# Patient Record
Sex: Female | Born: 1975 | Race: Asian | Hispanic: No | Marital: Married | Smoking: Never smoker
Health system: Southern US, Community
[De-identification: ages and names within clinical notes are randomized; demographics above are authoritative.]

## PROBLEM LIST (undated history)

## (undated) ENCOUNTER — Inpatient Hospital Stay (HOSPITAL_COMMUNITY): Payer: Self-pay

## (undated) ENCOUNTER — Inpatient Hospital Stay (HOSPITAL_COMMUNITY): Payer: BC Managed Care – PPO

## (undated) DIAGNOSIS — Z789 Other specified health status: Secondary | ICD-10-CM

## (undated) HISTORY — PX: WISDOM TOOTH EXTRACTION: SHX21

---

## 2012-11-02 LAB — OB RESULTS CONSOLE GC/CHLAMYDIA
Chlamydia: NEGATIVE
Gonorrhea: NEGATIVE

## 2012-11-02 LAB — OB RESULTS CONSOLE ANTIBODY SCREEN: Antibody Screen: NEGATIVE

## 2012-11-02 LAB — OB RESULTS CONSOLE RUBELLA ANTIBODY, IGM: RUBELLA: IMMUNE

## 2012-11-02 LAB — OB RESULTS CONSOLE HEPATITIS B SURFACE ANTIGEN: Hepatitis B Surface Ag: NEGATIVE

## 2012-11-02 LAB — OB RESULTS CONSOLE ABO/RH: RH Type: POSITIVE

## 2012-11-02 LAB — OB RESULTS CONSOLE RPR: RPR: NONREACTIVE

## 2012-11-02 LAB — OB RESULTS CONSOLE HIV ANTIBODY (ROUTINE TESTING): HIV: NONREACTIVE

## 2012-11-06 ENCOUNTER — Inpatient Hospital Stay (HOSPITAL_COMMUNITY): Admission: AD | Admit: 2012-11-06 | Payer: Self-pay | Source: Ambulatory Visit | Admitting: Obstetrics and Gynecology

## 2013-01-12 ENCOUNTER — Inpatient Hospital Stay (HOSPITAL_COMMUNITY)
Admission: AD | Admit: 2013-01-12 | Discharge: 2013-01-12 | Disposition: A | Discharge status: Home / Self Care | Payer: BC Managed Care – PPO | Source: Ambulatory Visit | Attending: Obstetrics and Gynecology | Admitting: Obstetrics and Gynecology

## 2013-01-12 ENCOUNTER — Inpatient Hospital Stay (HOSPITAL_COMMUNITY): Payer: BC Managed Care – PPO

## 2013-01-12 ENCOUNTER — Encounter (HOSPITAL_COMMUNITY): Payer: Self-pay

## 2013-01-12 DIAGNOSIS — O99891 Other specified diseases and conditions complicating pregnancy: Secondary | ICD-10-CM | POA: Insufficient documentation

## 2013-01-12 DIAGNOSIS — N949 Unspecified condition associated with female genital organs and menstrual cycle: Secondary | ICD-10-CM | POA: Insufficient documentation

## 2013-01-12 HISTORY — DX: Other specified health status: Z78.9

## 2013-01-12 LAB — URINALYSIS, ROUTINE W REFLEX MICROSCOPIC
Glucose, UA: NEGATIVE mg/dL
Hgb urine dipstick: NEGATIVE
Ketones, ur: NEGATIVE mg/dL
Protein, ur: NEGATIVE mg/dL
Specific Gravity, Urine: <1.005 — ABNORMAL LOW (ref 1.005–1.030)
Urobilinogen, UA: 0.2 mg/dL (ref 0.0–1.0)

## 2013-01-12 LAB — URINE MICROSCOPIC-ADD ON

## 2013-01-12 LAB — WET PREP, GENITAL: Trich, Wet Prep: NONE SEEN

## 2013-01-12 NOTE — MAU Note (Signed)
Patient is in with concern of possible rupture of membranes. She states she have increased watery clear vaginal discharge for 3 days. She states that she wears a pantiliner and it has gotten soaked. She denies vaginal bleeding, abdominal pain. She have twin. She reports good fetal movements

## 2013-01-12 NOTE — MAU Provider Note (Signed)
Pt with complaint of watery copious discharge that soaked a panty liner.  She denies having any bleeding, abd pain, ctxs or IC in the last 24 hrs  BP 119/71  Pulse 76  Temp(Src) 98.6 F (37 C) (Oral)  Resp 18  Ht 5\' 2"  (1.575 m)  Wt 152 lb 4 oz (69.06 kg)  BMI 27.84 kg/m2  FHTS Baseline: a 151 b 158 bpm  Toco none  Pt in NAD CV RRR Lungs CTAB abd  Gravid soft and NT GU no vb.  V/V WNL.  cx L/CL white discharge in the vault.  No blood EXt no calf tenderness Results for orders placed during the hospital encounter of 01/12/13 (from the past 72 hour(s))  URINALYSIS, ROUTINE W REFLEX MICROSCOPIC     Status: Abnormal   Collection Time    01/12/13 12:45 PM      Result Value Range   Color, Urine STRAW (*) YELLOW   APPearance CLEAR  CLEAR   Specific Gravity, Urine <1.005 (*) 1.005 - 1.030   pH 6.0  5.0 - 8.0   Glucose, UA NEGATIVE  NEGATIVE mg/dL   Hgb urine dipstick NEGATIVE  NEGATIVE   Bilirubin Urine NEGATIVE  NEGATIVE   Ketones, ur NEGATIVE  NEGATIVE mg/dL   Protein, ur NEGATIVE  NEGATIVE mg/dL   Urobilinogen, UA 0.2  0.0 - 1.0 mg/dL   Nitrite NEGATIVE  NEGATIVE   Leukocytes, UA TRACE (*) NEGATIVE  URINE MICROSCOPIC-ADD ON     Status: Abnormal   Collection Time    01/12/13 12:45 PM      Result Value Range   Squamous Epithelial / LPF RARE  RARE   WBC, UA 0-2  <3 WBC/hpf   Bacteria, UA FEW (*) RARE    Assessment and Plan [redacted]w[redacted]d  Copious white discharge Ferning neg Check wet prep Check [place] for cervical length and fluid

## 2013-02-12 ENCOUNTER — Other Ambulatory Visit: Payer: Self-pay | Admitting: Obstetrics and Gynecology

## 2013-02-12 ENCOUNTER — Ambulatory Visit (HOSPITAL_COMMUNITY)
Admission: RE | Admit: 2013-02-12 | Discharge: 2013-02-12 | Disposition: A | Discharge status: Home / Self Care | Payer: BC Managed Care – PPO | Source: Ambulatory Visit | Attending: Obstetrics and Gynecology | Admitting: Obstetrics and Gynecology

## 2013-02-12 ENCOUNTER — Inpatient Hospital Stay (HOSPITAL_COMMUNITY)
Admission: AD | Admit: 2013-02-12 | Discharge: 2013-03-01 | DRG: 781 | Disposition: A | Discharge status: Home / Self Care | Payer: BC Managed Care – PPO | Source: Ambulatory Visit | Attending: Obstetrics and Gynecology | Admitting: Obstetrics and Gynecology

## 2013-02-12 ENCOUNTER — Encounter (HOSPITAL_COMMUNITY): Payer: Self-pay

## 2013-02-12 ENCOUNTER — Encounter (HOSPITAL_COMMUNITY): Payer: Self-pay | Admitting: General Practice

## 2013-02-12 DIAGNOSIS — O368290 Fetal anemia and thrombocytopenia, unspecified trimester, not applicable or unspecified: Secondary | ICD-10-CM | POA: Diagnosis present

## 2013-02-12 DIAGNOSIS — O4700 False labor before 37 completed weeks of gestation, unspecified trimester: Secondary | ICD-10-CM | POA: Diagnosis present

## 2013-02-12 DIAGNOSIS — IMO0001 Reserved for inherently not codable concepts without codable children: Secondary | ICD-10-CM

## 2013-02-12 DIAGNOSIS — D649 Anemia, unspecified: Secondary | ICD-10-CM | POA: Diagnosis present

## 2013-02-12 DIAGNOSIS — O09519 Supervision of elderly primigravida, unspecified trimester: Secondary | ICD-10-CM | POA: Diagnosis present

## 2013-02-12 DIAGNOSIS — O364XX0 Maternal care for intrauterine death, not applicable or unspecified: Secondary | ICD-10-CM | POA: Diagnosis present

## 2013-02-12 DIAGNOSIS — IMO0002 Reserved for concepts with insufficient information to code with codable children: Principal | ICD-10-CM | POA: Diagnosis present

## 2013-02-12 DIAGNOSIS — O30039 Twin pregnancy, monochorionic/diamniotic, unspecified trimester: Secondary | ICD-10-CM | POA: Diagnosis present

## 2013-02-12 DIAGNOSIS — O409XX0 Polyhydramnios, unspecified trimester, not applicable or unspecified: Secondary | ICD-10-CM | POA: Diagnosis present

## 2013-02-12 DIAGNOSIS — O99019 Anemia complicating pregnancy, unspecified trimester: Secondary | ICD-10-CM | POA: Diagnosis present

## 2013-02-12 LAB — CBC
MCH: 31.6 pg (ref 26.0–34.0)
MCHC: 34.6 g/dL (ref 30.0–36.0)
Platelets: 292 10*3/uL (ref 150–400)
WBC: 9.2 10*3/uL (ref 4.0–10.5)

## 2013-02-12 LAB — TYPE AND SCREEN: ABO/RH(D): B POS

## 2013-02-12 MED ORDER — ZOLPIDEM TARTRATE 5 MG PO TABS: 5.0000 mg | ORAL_TABLET | Freq: Every evening | ORAL | Status: DC | PRN

## 2013-02-12 MED ORDER — CALCIUM CARBONATE ANTACID 500 MG PO CHEW: 2.0000 | CHEWABLE_TABLET | ORAL | Status: DC | PRN

## 2013-02-12 MED ORDER — DOCUSATE SODIUM 100 MG PO CAPS
100.0000 mg | ORAL_CAPSULE | Freq: Every day | ORAL | Status: DC
  Administered 2013-02-14 – 2013-03-01 (×16): 100 mg via ORAL
  Filled 2013-02-12 (×17): qty 1

## 2013-02-12 MED ORDER — ACETAMINOPHEN 325 MG PO TABS: 650.0000 mg | ORAL_TABLET | ORAL | Status: DC | PRN

## 2013-02-12 MED ORDER — INFLUENZA VAC SPLIT QUAD 0.5 ML IM SUSP
0.5000 mL | INTRAMUSCULAR | Status: AC
  Administered 2013-02-13: 0.5 mL via INTRAMUSCULAR
  Filled 2013-02-12: qty 0.5

## 2013-02-12 MED ORDER — DEXTROSE IN LACTATED RINGERS 5 % IV SOLN
INTRAVENOUS | Status: DC
  Administered 2013-02-12: 20:00:00 via INTRAVENOUS

## 2013-02-12 MED ORDER — PRENATAL MULTIVITAMIN CH
1.0000 | ORAL_TABLET | Freq: Every day | ORAL | Status: DC
  Administered 2013-02-13 – 2013-03-01 (×17): 1 via ORAL
  Filled 2013-02-12 (×18): qty 1

## 2013-02-12 MED ORDER — BETAMETHASONE SOD PHOS & ACET 6 (3-3) MG/ML IJ SUSP
12.0000 mg | INTRAMUSCULAR | Status: AC
  Administered 2013-02-12 – 2013-02-13 (×2): 12 mg via INTRAMUSCULAR
  Filled 2013-02-12 (×2): qty 2

## 2013-02-12 NOTE — ED Notes (Signed)
FHR 145; moderate variability; variable decels noted; contractions every 1-3 minutes X 50-120 seconds and mild - moderate intensity; patient states she feels pressure when contraction traced; fetus very active.

## 2013-02-12 NOTE — ED Notes (Signed)
Patient off EFM and ambulated to bathroom to void. Given water po. Patient complains of pressure with contractions. Dr Loree Fee aware.

## 2013-02-12 NOTE — Consult Note (Signed)
MFM Consultation:  The patient presents for ultrasound and consultation secondary to her monochorionic diamnionic twin gestation. 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  Severe TTTS before 26 weeks of gestation is associated with high risks of fetal loss, perinatal death and subsequent handicap in survivors.  The perinatal mortality rate is up to 90% with untreated severe disease.   TTTS is mediated through vascular placental connections.  The etiology is unknown.   Interfetal anastomoses are present in almost all monochorionic twin pregnancies although only a small percentage develops TTTS.  Communications between the recipient twin (larger twin with polyhydramnios) and donor twin (smaller twin with oligohydramnios) may be artery to artery, vein to artery or vein to vein.  Most likely, AV anastomoses are primarily responsible for exchange of blood from the recipient to the donor twin.  The diagnosis of TTTS is suspected when the following are present: a monochorionic diamnionic gestation with a single placenta, thin intervening membrane, polyhydramnios in the recipient twin's amniotic cavity (deepest vertical pocket of 8 cm or greater) and oligohydramnios or anhydramnios in the donor twin's amniotic cavity (deepest vertical pocket of 2 cm or less or a stuck twin).  Additional signs may include a distended bladder in the recipient twin, a small or empty bladder and growth restriction in the donor twin, abnormal umbilical artery Doppler velocimetry in either twin, hydrops in either twin, cardiac enlargement or failure, or fetal demise.  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 The implications of this diagnosis and the general management were discussed with the patient.      Recommendations:  TTTS is staged (Stage I-V) depending on severity of disease.  Stage I disease is generally managed expectantly with frequent ultrasound monitoring.  With Stage II-V disease, invasive treatment options are discussed.  There have been many treatment options suggested for TTTS including selective fetocide, cord coagulation, serial amnioreduction, microseptostomy of the intertwin membrane and fetoscopic laser photocoagulation of placental vascular anastomoses.  Fetoscopic laser coagulation of placental anastomoses has evolved as a favored therapeutic option for severe TTTS that occurs before [redacted] weeks EGA.  For patients presenting later than [redacted] weeks gestation, amnioreduction or microspetostomy appears to be favored over other treatment options.    I placed the patient on CEFM and contacted Dr. Normand Sloop (patient's OB provider).  I also contacted Dr. Berneta Sages of [facility] along with her nurse Irving Burton.  I am awaiting their return call to generate a disposition.I placed the patient on CEFM and contacted Dr. Normand Sloop (patient's OB provider).  I also contacted Dr. Berneta Sages of [facility] along with her nurse Irving Burton to learn she is not a candidate for invasive procedures to treat TTTS.    Recommendations: Hence, recommendations include the following: -initiate course of antenatal steroids; -CEFM with delivery for evidence of fetal distress -twice weekly ultrasounds (Monday/Thursday) for Doppler and hydrops. -patient was sent to antepartum for direct admission with my  recommendations communicated to Dr. Normand Sloop and the  patient.  Time Spent: I spent in excess of 60 minutes in consultation with this patient to review records, evaluate her case, and provide her with an adequate discussion and education.  More than 50% of this time was spent in direct face-to-face counseling. It was a pleasure seeing your patient in the office today.  Thank you for consultation. Please do not hesitate to contact our service for any further questions.   Thank you,  Sue Gibson Sue Gibson, Sue Sjogren, MD, MS, FACOG Assistant Professor Section of Maternal-Fetal Medicine [facility]

## 2013-02-12 NOTE — H&P (Signed)
Sue Gibson is a 37 y.o. female presenting for newly diagnosed TTS and fetal demise of twin B. History OB History   Grav Para Term Preterm Abortions TAB SAB Ect Mult Living   1         0     Past Medical History  Diagnosis Date  . Medical history non-contributory    Past Surgical History  Procedure Laterality Date  . Wisdom tooth extraction     Family History: family history includes Hypertension in her brother and father. Social History:  reports that she has never smoked. She has never used smokeless tobacco. She reports that she does not drink alcohol or use illicit drugs.   Prenatal Transfer Tool  Maternal Diabetes: No Genetic Screening: Normal Maternal Ultrasounds/Referrals: Abnormal:  Findings:   Other: Fetal Ultrasounds or other Referrals:  Referred to Materal Fetal Medicine  Maternal Substance Abuse:  No Significant Maternal Medications:  None Significant Maternal Lab Results:  None Other Comments:  None  ROS Physical Examination: General appearance - alert, well appearing, and in no distress Mouth - mucous membranes moist, pharynx normal without lesions Neck - supple, no significant adenopathy Heart - normal rate, regular rhythm, normal S1, S2, no murmurs, rubs, clicks or gallops Abdomen - soft, nontender, nondistended, no masses or organomegaly Gravid soft and NT Extremities - peripheral pulses normal, no pedal edema, no clubbing or cyanosis     Blood pressure 110/59, pulse 79, temperature 98.2 F (36.8 C), temperature source Oral, resp. rate 18, height 5\' 2"  (1.575 m), weight 162 lb (73.483 kg), last menstrual period 08/17/2012. Exam Physical Exam  Prenatal labs: ABO, Rh: --/--/B POS (11/07 1735) Antibody: PENDING (11/07 1735) Rubella:   RPR:    HBsAg:    HIV:    GBS:     Assessment/Plan: TTS with fetal demise of twin B Polyhydramnios Pt is not a candidate for any intervention with surgery MFM consult appreciated Admit to antepartum Pt EFM is  category 1 with frequent contractions.  Will give iv fluids.  Probably a result of poly Dopplers and MCA normal on twin A.  Plan twice weekly dopplers Betamethasone Check blood sugars starting in am Would plan delivery for fetal distress if present Plan NICU consult Pt agrees with the plan     Charley Lafrance A 02/12/2013, 6:58 PM

## 2013-02-12 NOTE — ED Notes (Signed)
Patient states UCs feel like pressure.  Did not realize the pressure was UCs. Has been having them over the last 3 weeks.  UCs are not painful.

## 2013-02-12 NOTE — ED Notes (Signed)
Patient removed from monitors for transport to room 151.

## 2013-02-12 NOTE — ED Notes (Signed)
Strip review by Dr. Loree Fee.

## 2013-02-13 LAB — GLUCOSE, CAPILLARY
Glucose-Capillary: 141 mg/dL — ABNORMAL HIGH (ref 70–99)
Glucose-Capillary: 129 mg/dL — ABNORMAL HIGH (ref 70–99)
Glucose-Capillary: 130 mg/dL — ABNORMAL HIGH (ref 70–99)

## 2013-02-13 MED ORDER — INFLUENZA VAC SPLIT QUAD 0.5 ML IM SUSP
0.5000 mL | INTRAMUSCULAR | Status: DC
  Filled 2013-02-13: qty 0.5

## 2013-02-13 MED ORDER — INSULIN ASPART 100 UNIT/ML ~~LOC~~ SOLN
0.0000 [IU] | Freq: Four times a day (QID) | SUBCUTANEOUS | Status: DC | PRN
  Administered 2013-02-13 – 2013-02-14 (×3): 4 [IU] via SUBCUTANEOUS

## 2013-02-13 MED ORDER — LACTATED RINGERS IV SOLN
INTRAVENOUS | Status: DC
  Administered 2013-02-13 – 2013-02-15 (×6): via INTRAVENOUS

## 2013-02-13 NOTE — Progress Notes (Signed)
Patient ID: Sue Gibson, female   DOB: 12-29-1975, 37 y.o.   MRN: [id number] Pt without complaints.  No leakage of fluid or VB.  Good FM  BP 87/44  Pulse 75  Temp(Src) 98.2 F (36.8 C) (Oral)  Resp 20  Ht 5\' 2"  (1.575 m)  Wt 159 lb 11.2 oz (72.439 kg)  BMI 29.20 kg/m2  LMP 08/17/2012  FHTS 135 category 1  Toco irregular, every 10-40 minutes  Pt in NAD CV RRR Lungs CTAB abd  Gravid soft and NT GU no vb EXt no calf tenderness Results for orders placed during the hospital encounter of 02/12/13 (from the past 72 hour(s))  CBC     Status: Abnormal   Collection Time    02/12/13  5:35 PM      Result Value Range   WBC 9.2  4.0 - 10.5 K/uL   RBC 3.58 (*) 3.87 - 5.11 MIL/uL   Hemoglobin 11.3 (*) 12.0 - 15.0 g/dL   HCT 78.2 (*) 95.6 - 21.3 %   MCV 91.3  78.0 - 100.0 fL   MCH 31.6  26.0 - 34.0 pg   MCHC 34.6  30.0 - 36.0 g/dL   RDW 08.6  57.8 - 46.9 %   Platelets 292  150 - 400 K/uL  TYPE AND SCREEN     Status: None   Collection Time    02/12/13  5:35 PM      Result Value Range   ABO/RH(D) B POS     Antibody Screen NEG     Sample Expiration 02/15/2013    ABO/RH     Status: None   Collection Time    02/12/13  5:35 PM      Result Value Range   ABO/RH(D) B POS    GLUCOSE, CAPILLARY     Status: Abnormal   Collection Time    02/13/13  7:17 AM      Result Value Range   Glucose-Capillary 141 (*) 70 - 99 mg/dL   Comment 1 Notify RN      Assessment and Plan [redacted]w[redacted]d  Pt stable Received first dose of steroids Check blood sugars with ISS NICU consult today Change fluid to LR fllu vaccie Dopplers on Baby A on Monday

## 2013-02-13 NOTE — Consult Note (Signed)
Neonatology Consult to Antenatal Patient:  I was asked by Dr. Normand Sloop to see this patient in order to provide antenatal counseling due to twin gestation with fetal demise of Twin B at 63 5/7 weeks. 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 There is polyhydramnios of Twin A but no hydrops on ultrasound.  Sue Gibson was admitted 11/7 at 25 4/[redacted] weeks GA. She is currently not having active labor. She is getting BMZ and is being monitored with ultrasound twice weekly. This is her first pregnancy. The fetus is female.  I spoke with the patient and her husband. We discussed the worst case of delivery in the next 1-2 days, including usual DR management, possible respiratory complications and need for support, IV access, feedings (mother desires breast feeding), LOS, Mortality and Morbidity, and long term outcomes. She had a few questions, which I answered. I offered a NICU tour to any interested family members and would be glad to come back if she or her husband have more questions later.  Thank you for asking me to see this patient.  Sue Sou, MD Neonatologist  The total length of face-to-face or floor/unit time for this encounter was 25 minutes. Counseling and/or coordination of care was 15 minutes of the above.

## 2013-02-14 LAB — GLUCOSE, CAPILLARY
Glucose-Capillary: 127 mg/dL — ABNORMAL HIGH (ref 70–99)
Glucose-Capillary: 111 mg/dL — ABNORMAL HIGH (ref 70–99)
Glucose-Capillary: 88 mg/dL (ref 70–99)

## 2013-02-14 NOTE — Progress Notes (Addendum)
  Subjective: Pt sitting up in knitting.  No complaints. Denies LOF, VB.  Feels cramping occasionally mostly when she needs to uriate.  Objective: BP 99/47  Pulse 84  Temp(Src) 98.7 F (37.1 C) (Oral)  Resp 18  Ht 5\' 2"  (1.575 m)  Wt 73.528 kg (162 lb 1.6 oz)  BMI 29.64 kg/m2  LMP 08/17/2012     Gen:  NAD, A&O Abdomen:  No fundal tenderness Ext:  No edema, no calf tenderness.  SCDs off in bed.  EM:  Accelerations, good variability.  Occasional variable deceleration.  Occasional contractions, irritability.  Assessment / Plan: [redacted]w[redacted]d -Twin-Twin transfusion with demise of Baby B.  S/p BMZ.   Stable. Continuous fetal monitoring. Dopplers twice weekly, Monday and Thursday.  Sue Gibson 02/14/2013, 1:08 PM

## 2013-02-14 NOTE — Progress Notes (Signed)
  Subjective: Pt sitting up in bed watching TV.  Pt without complaints at this time.  No leakage of fluid or VB. Good FM.  Objective: BP 97/53  Pulse 92  Temp(Src) 98.7 F (37.1 C) (Oral)  Resp 18  Ht 5\' 2"  (1.575 m)  Wt 73.528 kg (162 lb 1.6 oz)  BMI 29.64 kg/m2  LMP 08/17/2012      FHT:  BL 140; Cat I UC:   None on toco and none reported  Assessment / Plan: [redacted]w[redacted]d  Pt stable  Received 2 doses of steroids - 11/7 and 11/8  Check blood sugars with ISS  NICU consult yesterday Dopplers on Baby A on Monday  Marykate Heuberger 02/14/2013, 10:41 AM

## 2013-02-15 ENCOUNTER — Inpatient Hospital Stay (HOSPITAL_COMMUNITY): Payer: BC Managed Care – PPO

## 2013-02-15 LAB — GLUCOSE, CAPILLARY
Glucose-Capillary: 82 mg/dL (ref 70–99)
Glucose-Capillary: 109 mg/dL — ABNORMAL HIGH (ref 70–99)
Glucose-Capillary: 97 mg/dL (ref 70–99)

## 2013-02-15 LAB — TYPE AND SCREEN
ABO/RH(D): B POS
Antibody Screen: NEGATIVE

## 2013-02-15 MED ORDER — SODIUM CHLORIDE 0.9 % IJ SOLN
3.0000 mL | Freq: Two times a day (BID) | INTRAMUSCULAR | Status: DC
  Administered 2013-02-15 – 2013-02-25 (×21): 3 mL via INTRAVENOUS

## 2013-02-15 NOTE — Progress Notes (Signed)
Sue Gibson  was seen today for an ultrasound appointment.  See full report in [facility].  Comments: [name] was seen for follow up.  Fetal demise of Twin B was recently diagnosed  - felt to be a consequence of TTTS.  The optimal management of a single twin demise in [facility] twins is unknown.  There is a significant risk (~20%) for neurological sequellae for the surviving twin.    Impression: [name] twin gestation t 26 0/7 weeks Demise of twin B - felt to be a consequence of TTTS Completed course of betamethasone  Twin A Felt to be recipient twin Polyhydramnios- no evidence of hydrops Active fetus with BPP of 8/8 Normal UA Dopplers for gestational age MCA Dopplers 1.01 MoM (no evidence of fetal anemia)  Twin B Demise- felt to be donor twin  Recommendations: Continue 2x weekly BPPs / Doppler studies Continuous fetal  monitoring - would move toward delivery for non reassuring testing in Twin A.  If stable over next several weeks, would consider fetal MRI of Twin A to evaluate possible white matter lesions or intracranial bleeds  Alpha Gula, MD

## 2013-02-15 NOTE — Progress Notes (Signed)
37 y.o. year old female,at [redacted]w[redacted]d gestation.  SUBJECTIVE:  The mother is very sad.  OBJECTIVE:  BP 101/58  Pulse 93  Temp(Src) 98.5 F (36.9 C) (Oral)  Resp 18  Ht 5\' 2"  (1.575 m)  Wt 163 lb (73.936 kg)  BMI 29.81 kg/m2  LMP 08/17/2012  Fetal Heart Tones:  Category 1 for this gestation  Contractions:          None  Nontender abdomen  Results for orders placed during the hospital encounter of 02/12/13 (from the past 24 hour(s))  GLUCOSE, CAPILLARY     Status: Abnormal   Collection Time    02/14/13  3:09 PM      Result Value Range   Glucose-Capillary 111 (*) 70 - 99 mg/dL   Comment 1 Notify RN    GLUCOSE, CAPILLARY     Status: None   Collection Time    02/14/13  8:37 PM      Result Value Range   Glucose-Capillary 88  70 - 99 mg/dL  GLUCOSE, CAPILLARY     Status: None   Collection Time    02/15/13  6:21 AM      Result Value Range   Glucose-Capillary 80  70 - 99 mg/dL  GLUCOSE, CAPILLARY     Status: None   Collection Time    02/15/13 10:30 AM      Result Value Range   Glucose-Capillary 82  70 - 99 mg/dL   Comment 1 Documented in Chart     Comment 2 Notify RN       ASSESSMENT:  [redacted]w[redacted]d Weeks Pregnancy  Twin twin transfusion syndrome  Monochorionic diamniotic twin gestation  Fetal demise of twin B  Polyhydramnios of twin A  No evidence of hydrops of twin A  Anemia  Reassuring evaluation of twin A.  PLAN:  Continue hospital management.  Twice weekly biophysical profiles with Doppler monitoring.  See maternal fetal medicine recommendations below   Leonard Schwartz, M.D.  Maternal fetal medicine recommendations:   Twin A  Felt to be recipient twin  Polyhydramnios- no evidence of hydrops  Active fetus with BPP of 8/8  Normal UA Dopplers for gestational age  MCA Dopplers 1.01 MoM (no evidence of fetal anemia)  Twin B  Demise- felt to be donor twin   Recommendations:  Continue 2x weekly BPPs / Doppler studies  Continuous fetal  monitoring - would move toward delivery for non reassuring testing in Twin A.  If stable over next several weeks, would consider fetal MRI of Twin A to evaluate possible white matter lesions or intracranial bleeds   Alpha Gula, MD

## 2013-02-15 NOTE — Progress Notes (Signed)
Ur chart review completed.  

## 2013-02-15 NOTE — Progress Notes (Signed)
I attempted a visit two times today--at first patient was asleep and then pt had a visitor.   I introduced myself to pt and will follow up tomorrow, but please page if needs arise sooner, [phone].  She is aware of on-going availability of chaplain support whenever she needs.  Agnes Lawrence Molleigh Huot 2:42 PM   02/15/13 1400  Clinical Encounter Type  Visited With Patient  Visit Type Initial

## 2013-02-16 DIAGNOSIS — O30039 Twin pregnancy, monochorionic/diamniotic, unspecified trimester: Secondary | ICD-10-CM | POA: Diagnosis present

## 2013-02-16 DIAGNOSIS — O364XX0 Maternal care for intrauterine death, not applicable or unspecified: Secondary | ICD-10-CM | POA: Diagnosis present

## 2013-02-16 LAB — CBC
HCT: 29.9 % — ABNORMAL LOW (ref 36.0–46.0)
MCV: 92.3 fL (ref 78.0–100.0)
Platelets: 255 10*3/uL (ref 150–400)
RBC: 3.24 MIL/uL — ABNORMAL LOW (ref 3.87–5.11)
RDW: 13.9 % (ref 11.5–15.5)
WBC: 8.7 10*3/uL (ref 4.0–10.5)

## 2013-02-16 LAB — GLUCOSE, CAPILLARY: Glucose-Capillary: 76 mg/dL (ref 70–99)

## 2013-02-16 MED ORDER — CEFAZOLIN SODIUM-DEXTROSE 2-3 GM-% IV SOLR
2.0000 g | Freq: Once | INTRAVENOUS | Status: AC | PRN
  Filled 2013-02-16: qty 50

## 2013-02-16 NOTE — Progress Notes (Signed)
Pt. Aware that she is off the monitor, but told me she wanted to finish eating breakfast before I adjusted the monitors

## 2013-02-16 NOTE — Progress Notes (Signed)
Patient ID: Sue Gibson, female   DOB: 1975/12/14, 37 y.o.   MRN: [id number] Sue Gibson is a 37 y.o. G1P0 at 107w1d by ultrasound admitted for management of Twin twin transfusion syndrome.  Subjective: GI: Denies nausea, vomiting or diarrhea., abdominal pain, excessive gas or bloating GU: Denies: dysuria, frequency/urgency, hematuria, genital discharge, vaginal bleeding, pelvic pain OB: excellent fetal movement        Objective: BP 99/58  Pulse 82  Temp(Src) 98.7 F (37.1 C) (Oral)  Resp 18  Ht 5\' 2"  (1.575 m)  Wt 163 lb (73.936 kg)  BMI 29.81 kg/m2  LMP 08/17/2012 I/O last 3 completed shifts: In: 3 [I.V.:3] Out: -     FHT:  FHR: 140s bpm,  variability: moderate,   accelerations:  Present,   decelerations:  Absent UC:   none SVE:    deferred  Labs: Lab Results  Component Value Date   WBC 9.2 02/12/2013   HGB 11.3* 02/12/2013   HCT 32.7* 02/12/2013   MCV 91.3 02/12/2013   PLT 292 02/12/2013   Results for orders placed during the hospital encounter of 02/12/13 (from the past 24 hour(s))  GLUCOSE, CAPILLARY     Status: Abnormal   Collection Time    02/15/13  3:50 PM      Result Value Range   Glucose-Capillary 109 (*) 70 - 99 mg/dL   Comment 1 Documented in Chart     Comment 2 Notify RN    TYPE AND SCREEN     Status: None   Collection Time    02/15/13  5:00 PM      Result Value Range   ABO/RH(D) B POS     Antibody Screen NEG     Sample Expiration 02/18/2013    GLUCOSE, CAPILLARY     Status: None   Collection Time    02/15/13  9:15 PM      Result Value Range   Glucose-Capillary 97  70 - 99 mg/dL  GLUCOSE, CAPILLARY     Status: None   Collection Time    02/16/13  6:08 AM      Result Value Range   Glucose-Capillary 76  70 - 99 mg/dL    Assessment MD twins at 70 w 1 day with single twin demise  TTTS  Polyhydramnios in living recipient twin Nl cbgs after beta methasone  Plan Explained in detail the likelihood that C/S delivery would be required for fetal  distress.  Reviewed indications, risks and benefits.  Pt signed consent. All questions answered. Will d/c cbgs.   1 hr glucola on 03/01/13  Per MFM: Continue 2x weekly BPPs / Doppler studies  Continuous fetal monitoring - would move toward delivery for non reassuring testing in Twin A.  If stable over next several weeks, would consider fetal MRI of Twin A to evaluate possible white matter lesions or intracranial bleeds          Sue Gibson P 02/16/2013, 10:55 AM

## 2013-02-16 NOTE — Progress Notes (Signed)
I received a Spiritual Care consult to see Sue Gibson.  She was able to share with me some of her grief and sadness about the baby they lost.  It is difficult for her to talk about this and as she said, it is difficult for her to take in all that has happened.  She is trying to stay positive and hopeful for baby Sue Gibson.  She has a good support system, particularly from her husband, who lives in [place], and from her sister who lives in [place].    We will continue to follow her for support, but please also page as needs arise.  [address] [place] Pager, [phone] 11:43 AM   02/16/13 1100  Clinical Encounter Type  Visited With Patient  Visit Type Spiritual support  Referral From Nurse

## 2013-02-17 NOTE — Progress Notes (Signed)
Antenatal Nutrition Assessment:  Currently  26 1/[redacted] weeks gestation, with TTTS, IUFD twin B. Height  62" Weight 158 lbs pre-pregnancy weight 138 lbs.Pre-pregnancy  BMI 25.3  IBW 110 lbs  Total weight gain 20 lbs. Weight gain goals 25-35 lbs.   Estimated needs: 16-1800 kcal/day, 58-68 grams protein/day, 1.9 liters fluid/day  Regular diet tolerated well, appetite good. Snack menu provided Current diet prescription will provide for increased needs.  No abnormal nutrition related labs Hemoglobin & Hematocrit     Component Value Date/Time   HGB 10.4* 02/16/2013 1700   HCT 29.9* 02/16/2013 1700    Nutrition Dx: Increased nutrient needs r/t pregnancy and fetal growth requirements aeb [redacted] weeks gestation.  No educational needs assessed at this time.  Elisabeth Cara M.Odis Luster LDN Neonatal Nutrition Support Specialist Pager [phone]

## 2013-02-17 NOTE — Progress Notes (Addendum)
Patient ID: Sue Gibson, female   DOB: 1975-06-13, 37 y.o.   MRN: [id number] Sue Gibson is a 37 y.o. G1P0 at [redacted]w[redacted]d  Subjective: No complaints.  Asked to review tracing by RN.  Pt reports +FM, no LOF, no VB and does not feel ctxs but does feel occas tightening that she thought were baby movements.  Objective: BP 104/66  Pulse 83  Temp(Src) 98.8 F (37.1 C) (Oral)  Resp 18  Ht 5\' 2"  (1.575 m)  Wt 71.804 kg (158 lb 4.8 oz)  BMI 28.95 kg/m2  LMP 08/17/2012      Physical Exam:  Gen: alert and in good spirits Chest/Lungs: cta bilaterally  Heart/Pulse: RRR  Abdomen: soft, gravid, nontender Uterine fundus: soft, nontender Skin & Color: warm and dry  Neurological: AOx3 EXT: negative Homan's b/l, edema neg  FHT:  FHR: 140s-150s bpm, variability: moderate,  accelerations:  Abscent,  decelerations:  Present occas variable UC:   occas SVE:    deferred  Labs: Lab Results  Component Value Date   WBC 8.7 02/16/2013   HGB 10.4* 02/16/2013   HCT 29.9* 02/16/2013   MCV 92.3 02/16/2013   PLT 255 02/16/2013    Assessment and Plan: has Monochorionic diamniotic twin gestation; Fetal demise, greater than 22 weeks, antepartum; and Monochorionic diamniotic twin pregnancy with 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 Cont current plan U/S Mon and Thurs and continuous monitoring S/p BMZ CAT 2 -overall reassuring at 26wks  Purcell Nails 02/17/2013, 3:55 PM

## 2013-02-18 ENCOUNTER — Inpatient Hospital Stay (HOSPITAL_COMMUNITY): Payer: BC Managed Care – PPO

## 2013-02-18 LAB — TYPE AND SCREEN
ABO/RH(D): B POS
Antibody Screen: NEGATIVE

## 2013-02-18 NOTE — Progress Notes (Signed)
37 y.o. year old female,at [redacted]w[redacted]d gestation.  SUBJECTIVE:  Doing well. She denies bleeding, leakage of fluid, abdominal pain.  OBJECTIVE:  BP 117/91  Pulse 107  Temp(Src) 98.6 F (37 C) (Oral)  Resp 18  Ht 5\' 2"  (1.575 m)  Wt 159 lb 8 oz (72.349 kg)  BMI 29.17 kg/m2  LMP 08/17/2012  Fetal Heart Tones:  Category 1  Contractions:          Few  Abdomen: Nontender  ASSESSMENT:  [redacted]w[redacted]d Weeks Pregnancy  Preterm and premature rupture membranes  Twin gestation  Twin-twin transfusion syndrome  Fetal demise of twin B.  Polyhydramnios of twin A.  PLAN:  Ultrasound today showed a biophysical profile of 8 out of 8. Doppler studies were normal for twin A. Fetal demise documented for twin B.  Maternal fetal medicine recommends that we repeat her Doppler studies with biophysical profiles twice each week.  Leonard Schwartz, M.D.

## 2013-02-18 NOTE — Progress Notes (Signed)
Sue Gibson  was seen today for an ultrasound appointment.  See full report in [facility].  Comments:  Impression: [name] twin gestation at 97 3/7 weeks Demise of twin B - felt to be a consequence of TTTS  Twin A Felt to be recipient twin Polyhydramnios with max verticle pocket of 10 cm - no evidence of hydrops Active fetus with BPP of 8/8 Normal UA Dopplers for gestational age MCA Dopplers 1.18 MoM (no evidence of fetal anemia)  Twin B Demise- felt to be donor twin  Recommendations: Continue continuous fetal monitoring, 2x weekly BPPs with UA / MCA Dopplers  Alpha Gula, MD

## 2013-02-18 NOTE — Progress Notes (Signed)
02/18/13 1600  Clinical Encounter Type  Visited With Patient  Visit Type Follow-up;Spiritual support;Social support  Referral From Chaplain  Spiritual Encounters  Spiritual Needs Emotional;Grief support  Stress Factors  Patient Stress Factors Loss;Major life changes  Family Stress Factors Loss;Major life changes   Made my first visit with pt, who goes by Sue Gibson, per referral from [facility].  Sue Gibson was calm and working to stay positive.  She reports good support from family, friends, and work.  Per pt, she hasn't yet shared widely in her community about the loss of one of the twins; she prefers more privacy and fewer questions.  In our conversation she tended to speak indirectly about her loss.    Provided spiritual and emotional support; empathic, reflective listening; affirmation; and reminder about chaplain services and availability.  Spiritual Care will continue to follow for emotional support and bereavement care, but please also page as needs arise:  [phone].  Thank you.  [address] [place], [place] [phone]

## 2013-02-19 NOTE — Progress Notes (Addendum)
Patient ID: Simona Margarita Sermons, female   DOB: 1975-04-13, 37 y.o.   MRN: [id number]  Hospital day # 7 pregnancy at [redacted]w[redacted]d for: TTTS, IUFD twin B,   S: Denies c/o, no ctx, VB, or LOF, GFM, no change in discharge          O: BP 106/55  Pulse 75  Temp(Src) 98.9 F (37.2 C) (Oral)  Resp 18  Ht 5\' 2"  (1.575 m)  Wt 159 lb 9.6 oz (72.394 kg)  BMI 29.18 kg/m2  LMP 08/17/2012 Gen: A&O  Heart: RRR Lungs: CTAB Abd: soft, NT, BS x4 FH c/w dates Pelvic: deferred EXT: neg homans, no significant swelling, SCD's on  FHR cat 2, w occ periods of cat 1, overall reassuring for GA  toco occ UI   A: [redacted]w[redacted]d with TTTS, IUFD twin B      stable   P: continue current plan of care BPP's on Mon/Thurs MD's to follow   Malissa Hippo  CNM  02/19/2013 11:15 AM    Telephone call from, RN concerning 6 contractions in the last hour. The patient is not feeling any of the contractions and there are no fetal heart rate decelerations. I confirmed with our maternal fetal medicine consultant and discussed with the nurse that the patient would not undergo tocolyse this. If she actually develops labor. She will undergo neuro prophylaxis with magnesium sulfate and delivery by cesarean section. This has been explained to the patient in great detail and is outlined in my previous note.

## 2013-02-19 NOTE — Progress Notes (Signed)
This was a follow-up visit with pt.  Sue Gibson, as she prefers to be called, is coping as best she can with her situation by trying to stay positive and focusing on her baby who is alive, Sue Gibson.  I slowly began to talk with her today about what to expect when she delivers her babies.  I helped her begin to start thinking about things such as holding her baby who did not survive (Sue Gibson), the memory box that she will receive, plans for caring for her baby's body and how they want to talk about this with friends and family.  She was able to take in some of this and we can continue to slowly talk about more details.  She stated that she wants to talk with her husband about some of these things.  Although it was a difficult conversation for her, I talked with her about how thinking some of this through now will help her to prepare emotionally for the day of delivery, whenever that is.    She is aware of on-going availability of chaplain support and we will also continue to follow up with her.  Please also page as needs arise, [phone].  Agnes Lawrence Malic Rosten 12:01 PM   02/19/13 1100  Clinical Encounter Type  Visited With Patient  Visit Type Spiritual support  Stress Factors  Patient Stress Factors Loss

## 2013-02-20 LAB — CBC
HCT: 32.5 % — ABNORMAL LOW (ref 36.0–46.0)
MCHC: 34.5 g/dL (ref 30.0–36.0)
RDW: 13.9 % (ref 11.5–15.5)
WBC: 9.5 10*3/uL (ref 4.0–10.5)

## 2013-02-20 NOTE — Progress Notes (Signed)
Nutrition Follow-up :Consult for weight loss  Pt seen 11/12, at that time changed to diet order that allows snacks TID, is able to order from retail. Pt indicated appetite was good.  Weight loss is significant and occurred rather quickly, not easily explained given adequate appetite. This type of weight loss is usually only observed if pt has GI symptoms or n/v.  Wt Readings from Last 10 Encounters:  02/20/13 153 lb 9.6 oz (69.673 kg)  02/12/13 162 lb (73.483 kg)  01/12/13 152 lb 4 oz (69.06 kg)    Have spoken to pts RN who has reinforced need to eat meals and snacks. Will allow double protein portions at meals. The next step would be to add Ensure at snack time.  Elisabeth Cara M.Odis Luster LDN Neonatal Nutrition Support Specialist Pager [phone]

## 2013-02-20 NOTE — Progress Notes (Addendum)
Patient ID: Rexanna Margarita Sermons, female   DOB: [birth year], 37 y.o.   MRN: [id number]  Hospital day # 9 pregnancy at [redacted]w[redacted]d for: TTTS, IUFD twin B,   S: Pt resting comfortably in chair.  No acute complaints. no ctx, VB, or LOF, +FM, no change in discharge          O: BP 111/59  Pulse 84  Temp(Src) 97.6 F (36.4 C) (Oral)  Resp 18  Ht 5\' 2"  (1.575 m)  Wt 69.673 kg (153 lb 9.6 oz)  BMI 28.09 kg/m2  LMP 08/17/2012 Gen: A&O  Heart: RRR Lungs: CTAB Abd: soft, NT, BS x4 FH c/w dates Pelvic: deferred EXT: neg homans, no significant swelling, SCD's on  FHR cat 2, w occ periods of cat 1, overall reassuring for GA  toco: occasional contractions, per patient only feeling mild pressure (no change from previously)  SVE: deferred  A: 37y G1P0 @ [redacted]w[redacted]d with TTTS, IUFD twin B      stable  P: continue current plan of care BPP's on Mon/Thurs MD's to follow   Myna Hidalgo, DO [phone] (pager) [phone] (office)

## 2013-02-21 LAB — TYPE AND SCREEN
ABO/RH(D): B POS
Antibody Screen: NEGATIVE

## 2013-02-21 NOTE — Progress Notes (Signed)
Patient ID: Sue Gibson, female   DOB: 08-Dec-1975, 37 y.o.   MRN: [id number]  Hospital day # 10 pregnancy at [redacted]w[redacted]d for: TTTS, IUFD twin B,   S: Pt resting comfortably in chair.  No acute complaints. No  VB, or LOF, +FM.  Feeling occasional abdominal pressure, but no regular contractions.  No F/C/CP/SOB.           O: BP 108/64  Pulse 75  Temp(Src) 97.9 F (36.6 C) (Oral)  Resp 18  Ht 5\' 2"  (1.575 m)  Wt 69.809 kg (153 lb 14.4 oz)  BMI 28.14 kg/m2  LMP 08/17/2012 Gen: A&O  Heart: RRR Lungs: CTAB Abd: soft, NT, BS x4 FH c/w dates Pelvic: deferred EXT: neg homans, no significant swelling, SCD's on  FHR 140, moderate variability, + 10x10 accels, no decel toco: +irritability SVE: deferred  A: 37y G1P0 @ [redacted]w[redacted]d with TTTS, IUFD twin B      stable  P: continue current plan of care BPP's on Mon/Thurs MD's to follow S/p Betamethasone course Will plan to deliver by C-section if evidence of fetal distress   Myna Hidalgo, DO [phone] (pager) [phone] (office)

## 2013-02-22 ENCOUNTER — Inpatient Hospital Stay (HOSPITAL_COMMUNITY): Payer: BC Managed Care – PPO

## 2013-02-22 NOTE — Progress Notes (Signed)
Pt off the monitor to shower

## 2013-02-22 NOTE — Progress Notes (Signed)
PT back in the bed after eating dinner in the rocking chair

## 2013-02-22 NOTE — Progress Notes (Signed)
Ur chart review completed.  

## 2013-02-22 NOTE — Progress Notes (Signed)
Patient ID: Sue Gibson, female   DOB: [birth year], 37 y.o.   MRN: [id number] Sue Gibson is a 37 y.o. G1P0 at [redacted]w[redacted]d by ultrasound admitted for TTTS  Subjective: GI: negative GU: Denies: dysuria, frequency/urgency, hematuria, genital discharge, vaginal bleeding, pelvic pain OB: Good fetal movement        Objective: BP 107/57  Pulse 82  Temp(Src) 97.9 F (36.6 C) (Oral)  Resp 20  Ht 5\' 2"  (1.575 m)  Wt 162 lb 3.2 oz (73.573 kg)  BMI 29.66 kg/m2  LMP 08/17/2012      FHT: Stable with occassional variables and some accels 10-15bpmx15secs UC:   none SVE:      Labs: Lab Results  Component Value Date   WBC 9.5 02/20/2013   HGB 11.2* 02/20/2013   HCT 32.5* 02/20/2013   MCV 91.8 02/20/2013   PLT 276 02/20/2013    Assessment / Plan: ttts at 27 wks MCA dopplers and BPP results pending  Plan:  Continuous monitoring. [name]    Sue Gibson P 02/22/2013, 3:53 PM

## 2013-02-22 NOTE — Progress Notes (Signed)
This was a follow-up with pt.  She is in good spirits and is trying to remain positive.  She and her husband had an opportunity to talk about some of their plans for when they deliver their babies.  Her husband would definitely like to be able to hold both babies.  Sue Gibson, "[name]", states that at this time she doesn't feel like she would be emotionally able to hold her baby who has died. They are interested in having Baby B cremated.  I told them that their decisions at this point may shift over time and affirmed that this was fine to change their plans at this point.  They have begun informing other family members about their situation and [name] stated that this is starting to become easier over time.  We will continue to follow this family for support, but please page as needs arise.  [employer] Pager, [phone] 3:13 PM   02/22/13 1500  Clinical Encounter Type  Visited With Patient  Visit Type Spiritual support;Follow-up  Spiritual Encounters  Spiritual Needs Emotional

## 2013-02-23 NOTE — Progress Notes (Signed)
Patient ID: Sue Gibson, female   DOB: [birth year], 37 y.o.   MRN: [id number] Pt without complaints.  No leakage of fluid or VB.  Good FM  BP 104/60  Pulse 84  Temp(Src) 97.9 F (36.6 C) (Oral)  Resp 18  Ht 5\' 2"  (1.575 m)  Wt 164 lb 6.4 oz (74.571 kg)  BMI 30.06 kg/m2  LMP 08/17/2012  FHTS Baseline: 135-145 bpm, Variability: Fair (1-6 bpm), Accelerations: Non-reactive but appropriate for gestational age and occ variable with good recovery  Toco occ ctx  Pt in NAD CV RRR Lungs CTAB abd  Gravid soft and NT GU no vb EXt no calf tenderness Results for orders placed during the hospital encounter of 02/12/13 (from the past 72 hour(s))  CBC     Status: Abnormal   Collection Time    02/20/13  5:50 PM      Result Value Range   WBC 9.5  4.0 - 10.5 K/uL   RBC 3.54 (*) 3.87 - 5.11 MIL/uL   Hemoglobin 11.2 (*) 12.0 - 15.0 g/dL   HCT 56.2 (*) 13.0 - 86.5 %   MCV 91.8  78.0 - 100.0 fL   MCH 31.6  26.0 - 34.0 pg   MCHC 34.5  30.0 - 36.0 g/dL   RDW 78.4  69.6 - 29.5 %   Platelets 276  150 - 400 K/uL  TYPE AND SCREEN     Status: None   Collection Time    02/21/13  6:25 PM      Result Value Range   ABO/RH(D) B POS     Antibody Screen NEG     Sample Expiration 02/24/2013      Assessment and Plan [redacted]w[redacted]d TWINS and fetal demise of one twin thought to be from TTS Fetal monitoring and testing continues to be reassuring Continue current care

## 2013-02-24 LAB — TYPE AND SCREEN: ABO/RH(D): B POS

## 2013-02-24 NOTE — Progress Notes (Signed)
Patient ID: Sue Gibson, female   DOB: 1976/03/18, 37 y.o.   MRN: [id number] Pt without complaints.  No leakage of fluid or VB.  Good FM  BP 98/52  Pulse 82  Temp(Src) 98.6 F (37 C) (Oral)  Resp 16  Ht 5\' 2"  (1.575 m)  Wt 168 lb 12.8 oz (76.567 kg)  BMI 30.87 kg/m2  LMP 08/17/2012  FHTS 150 WITH ACCELS OCC VARIBLE  Toco irregular, every 5-35 minutes  Pt in NAD CV RRR Lungs CTAB abd  Gravid soft and NT GU no vb EXt no calf tenderness Results for orders placed during the hospital encounter of 02/12/13 (from the past 72 hour(s))  TYPE AND SCREEN     Status: None   Collection Time    02/21/13  6:25 PM      Result Value Range   ABO/RH(D) B POS     Antibody Screen NEG     Sample Expiration 02/24/2013      Assessment and Plan [redacted]w[redacted]d  TTS IUFD OF DONOR TWIN.  RECIPIENT TWIN WITH POLYHYDRAMNIOS AND CURRENT REASSURING TESTING Continue current care Pt to have BPP and dopplers tomorrow Plan glucola at 28 weeks

## 2013-02-25 ENCOUNTER — Inpatient Hospital Stay (HOSPITAL_COMMUNITY)
Admit: 2013-02-25 | Discharge: 2013-02-25 | Disposition: A | Discharge status: Home / Self Care | Payer: BC Managed Care – PPO | Attending: Obstetrics and Gynecology | Admitting: Obstetrics and Gynecology

## 2013-02-25 NOTE — Progress Notes (Signed)
Patient ID: Sue Gibson, female   DOB: 20-Jan-1976, 37 y.o.   MRN: [id number] Pt without complaints.  No leakage of fluid or VB.  Good FM  BP 104/57  Pulse 79  Temp(Src) 98.9 F (37.2 C) (Oral)  Resp 18  Ht 5\' 2"  (1.575 m)  Wt 156 lb 4.8 oz (70.897 kg)  BMI 28.58 kg/m2  LMP 08/17/2012  FHTS 145-150 with accels.  good variability  Toco irregular, every 5-35 minutes  Pt in NAD CV RRR Lungs CTAB abd  Gravid soft and NT GU no vb EXt no calf tenderness Results for orders placed during the hospital encounter of 02/12/13 (from the past 72 hour(s))  TYPE AND SCREEN     Status: None   Collection Time    02/24/13  1:47 PM      Result Value Range   ABO/RH(D) B POS     Antibody Screen NEG     Sample Expiration 02/27/2013      Assessment and Plan [redacted]w[redacted]d  TTS with fetal demise of twin B BPP 8/8 Dopplers 92 % MCA WNL Pt request DC of heploc will DC IF testing remains normal next week pt may be a candidate for out pt testing per MFM They also recommend fetal echo and MRI at 28-30 weeks to r/o ICH Pt updated on the plan

## 2013-02-25 NOTE — Progress Notes (Signed)
Sue Gibson  was seen today for an ultrasound appointment.  See full report in [facility].  Impression: [name] twin gestation at 14 3/7 weeks Demise of twin B - felt to be a consequence of TTTS  Twin A Felt to be recipient twin Polyhydramnios with max verticle pocket of 10.2 cm - no evidence of hydrops, stable Active fetus with BPP of 8/8 UA Dopplers at 92nd %tile for gestational age - no AEDF or REDF MCA Dopplers 1.32 MoM (no evidence of fetal anemia)  Twin B Demise- felt to be donor twin  Recommendations: Continue 2x weekly BPPs with UA and MCA Doppler studies If patient remains stable, may consider hospital discharge with close outpatient follow up early next week (2-3x weekly BPPs with Doppler studies) Would recommend fetal echo with Peds cardiology as outpatient Fetal MRI at 28-30 weeks to evaluate for possible intracranial hemorrhage in surviving twin  Alpha Gula, MD

## 2013-02-26 NOTE — Progress Notes (Signed)
37 y.o. year old female,at [redacted]w[redacted]d gestation.  SUBJECTIVE:  Doing well.  OBJECTIVE:  BP 97/41  Pulse 74  Temp(Src) 97.5 F (36.4 C) (Oral)  Resp 18  Ht 5\' 2"  (1.575 m)  Wt 156 lb 4.8 oz (70.897 kg)  BMI 28.58 kg/m2  LMP 08/17/2012  Fetal Heart Tones:  Cat 1  Contractions:          None  Abd: nontender  ASSESSMENT:  [redacted]w[redacted]d Weeks Pregnancy  TTT Syndrome  Fetal demise of twin 1  PLAN:  Continue in-hospital care  Possible discharge to home later.  Leonard Schwartz, M.D.

## 2013-02-26 NOTE — Progress Notes (Signed)
37 y.o. year old female,at [redacted]w[redacted]d gestation.  SUBJECTIVE:  Doing well.  OBJECTIVE:  BP 100/58  Pulse 77  Temp(Src) 99 F (37.2 C) (Oral)  Resp 18  Ht 5\' 2"  (1.575 m)  Wt 150 lb 14.4 oz (68.448 kg)  BMI 27.59 kg/m2  LMP 08/17/2012  Fetal Heart Tones:  Category 1  Contractions:          None   ASSESSMENT:  [redacted]w[redacted]d Weeks Pregnancy  Preterm and premature rupture membranes  Twin twin transfusion syndrome  PLAN:  The patient is more comfortable with the concept of outpatient management. She will talk with her husband and we will try to make appropriate arrangements.  Leonard Schwartz, M.D.

## 2013-02-26 NOTE — Progress Notes (Signed)
02/26/13 1500  Clinical Encounter Type  Visited With Patient and family together (sister)  Visit Type Follow-up;Spiritual support;Social support  Spiritual Encounters  Spiritual Needs Emotional   Sue Gibson was in good spirits today and appeared calm and grounded.  She was very pleased to learn form Dr Stefano Gaul that she may get to go home next week, in time for Thanksgiving.  She reports great support from her family and shared her current preference not to see baby Allie at delivery, naming that her husband very much wants to see and hold Allie.  Per pt, they plan cremation and want baby Allie's remains to go with her husband (they live separately).  I reassured her that staff will work to honor their preferences and comfort levels at/after delivery.  Provided reflective listening and affirmation.  [address] [place], [place] [phone]

## 2013-02-26 NOTE — Progress Notes (Signed)
Pt taking self off the monitor and up to bathroom to shower

## 2013-02-27 LAB — TYPE AND SCREEN

## 2013-02-27 NOTE — Progress Notes (Signed)
37 y.o. year old female,at [redacted]w[redacted]d gestation.  SUBJECTIVE:  Doing well  OBJECTIVE:  BP 100/50  Pulse 77  Temp(Src) 98 F (36.7 C) (Oral)  Resp 18  Ht 5\' 2"  (1.575 m)  Wt 152 lb (68.947 kg)  BMI 27.79 kg/m2  LMP 08/17/2012  Fetal Heart Tones:  Category 1  Contractions:          None  Abdomen is nontender  ASSESSMENT:  [redacted]w[redacted]d Weeks Pregnancy  Twin-twin transfusion syndrome  Fetal demise of twin B.  Polyhydramnios for twin A.  PLAN:  We will schedule an MRI to evaluate the fetal brain.  We'll plan a biophysical profile with Doppler studies on November 24.  The patient seems more comfortable with the concept of outpatient management. We will prepare for discharge on 03/01/2013.  Leonard Schwartz, M.D.

## 2013-02-27 NOTE — Progress Notes (Signed)
Sitting in chair to eat breakfast.

## 2013-02-28 MED ORDER — PRAMOXINE HCL 1 % RE FOAM: Freq: Three times a day (TID) | RECTAL | Status: DC | PRN

## 2013-02-28 MED ORDER — HYDROCORTISONE ACE-PRAMOXINE 1-1 % RE FOAM
1.0000 | Freq: Three times a day (TID) | RECTAL | Status: DC | PRN
  Filled 2013-02-28: qty 10

## 2013-02-28 MED ORDER — HYDROCORTISONE ACE-PRAMOXINE 1-1 % RE CREA: TOPICAL_CREAM | Freq: Three times a day (TID) | RECTAL | Status: DC

## 2013-02-28 NOTE — Progress Notes (Signed)
37 y.o. year old female,at [redacted]w[redacted]d gestation.  SUBJECTIVE:  Doing well. The patient is making arrangements to be discharged.  OBJECTIVE:  BP 94/45  Pulse 65  Temp(Src) 98.3 F (36.8 C) (Oral)  Resp 20  Ht 5\' 2"  (1.575 m)  Wt 155 lb 9.6 oz (70.58 kg)  BMI 28.45 kg/m2  LMP 08/17/2012  Fetal Heart Tones:  Category 1  Contractions:          None  Abdomen nontender Extremities within normal limits  ASSESSMENT:  [redacted]w[redacted]d Weeks Pregnancy  Twin-twin transfusion syndrome  Fetal demise of twin B  Polyhydramnios  PLAN:  Biophysical profile with Doppler studies, MRI, and one-hour Glucola screen is scheduled for 03/01/2013.  The patient was to be discharged on 03/01/2013 of all studies are normal.  The plan is to have Doppler studies and ultrasounds of 3 times each week.  The fetus will need to have an echocardiogram performed as an outpatient.  Leonard Schwartz, M.D.

## 2013-03-01 ENCOUNTER — Ambulatory Visit (HOSPITAL_COMMUNITY): Payer: BC Managed Care – PPO

## 2013-03-01 ENCOUNTER — Other Ambulatory Visit: Payer: Self-pay | Admitting: Obstetrics and Gynecology

## 2013-03-01 DIAGNOSIS — O30009 Twin pregnancy, unspecified number of placenta and unspecified number of amniotic sacs, unspecified trimester: Secondary | ICD-10-CM

## 2013-03-01 DIAGNOSIS — O09519 Supervision of elderly primigravida, unspecified trimester: Secondary | ICD-10-CM

## 2013-03-01 DIAGNOSIS — IMO0002 Reserved for concepts with insufficient information to code with codable children: Secondary | ICD-10-CM

## 2013-03-01 LAB — GLUCOSE TOLERANCE, 1 HOUR: Glucose, 1 Hour GTT: 164 mg/dL — ABNORMAL HIGH (ref 70–140)

## 2013-03-01 NOTE — Progress Notes (Signed)
Pt taking self off the monitor and getting up to get ready to leave for discharge

## 2013-03-01 NOTE — Discharge Summary (Signed)
Obstetric Discharge Summary Reason for Admission: [name] twins with demise of Donor Twin B Prenatal Procedures: ultrasound Intrapartum Procedures: n/a Postpartum Procedures: still pregnant undergoing 3x/wk BPP, UA and MCA dopplers and EFW q2wks.  Pt to have outpt fetal echo and MRI scheduled by MFM.  Pt will f/u with MFM on Wed and because of holiday will likely have testing through [facility] on Friday. Complications-Operative and Postpartum: none Hemoglobin  Date Value Range Status  02/20/2013 11.2* 12.0 - 15.0 g/dL Final     HCT  Date Value Range Status  02/20/2013 32.5* 36.0 - 46.0 % Final   No complaints.  Questions answered.  FKCs and precautions discussed.  UA doppler nl for gestational age today and MCA no fetal anemia.  Physical Exam:  General: alert and no distress Lochia: n/a Uterine Fundus: NT Incision: n/a DVT Evaluation: No evidence of DVT seen on physical exam.  Discharge Diagnoses: Stable and plan per Dr. Claudean Severance.  Pt will also be setup outpt for 3hr GTT because she had abnormal 1hr testing.  Discharge Information: Date: 03/01/2013 Activity: pelvic rest and bedrest Diet: routine Medications: PNV Condition: stable Instructions: refer to practice specific booklet and as discussed Discharge to: home   Newborn Data: <<This patient has not yet delivered during this pregnancy.>> Home with n/a.  Purcell Nails 03/01/2013, 2:08 PM

## 2013-03-01 NOTE — Progress Notes (Signed)
Sue Gibson  was seen today for an ultrasound appointment.  See full report in [facility].  Impression: [name] twin gestation at 45 0/7 weeks Demise of twin B - felt to be a consequence of TTTS  Twin A Felt to be recipient twin Polyhydramnios slightly improved (MVP 7.1 cm) Active fetus with BPP of 8/8 UA Dopplers normal for gestational age MCA Dopplers 1.41 MoM (no evidence of fetal anemia)  Twin B Demise- felt to be donor twin  Recommendations: May discontinue continuous monitoring - patient has been stable for > 1 week Feel that the patient may be discharged with close outpatient follow up - 2x weekly BPP, UA Dopplers and MCA Dopplers with MFM Fetal echo, Fetal MRI as outpatient.  Alpha Gula, MD

## 2013-03-01 NOTE — Progress Notes (Signed)
Pt off the monitor and taken to MFM

## 2013-03-03 ENCOUNTER — Ambulatory Visit (HOSPITAL_COMMUNITY)
Admit: 2013-03-03 | Discharge: 2013-03-03 | Disposition: A | Discharge status: Home / Self Care | Payer: BC Managed Care – PPO | Attending: Obstetrics and Gynecology | Admitting: Obstetrics and Gynecology

## 2013-03-03 DIAGNOSIS — O30009 Twin pregnancy, unspecified number of placenta and unspecified number of amniotic sacs, unspecified trimester: Secondary | ICD-10-CM

## 2013-03-03 DIAGNOSIS — IMO0002 Reserved for concepts with insufficient information to code with codable children: Secondary | ICD-10-CM | POA: Insufficient documentation

## 2013-03-03 DIAGNOSIS — O09519 Supervision of elderly primigravida, unspecified trimester: Secondary | ICD-10-CM | POA: Insufficient documentation

## 2013-03-05 ENCOUNTER — Ambulatory Visit (HOSPITAL_COMMUNITY)
Admission: RE | Admit: 2013-03-05 | Discharge: 2013-03-05 | Disposition: A | Discharge status: Home / Self Care | Payer: BC Managed Care – PPO | Source: Ambulatory Visit | Attending: Obstetrics and Gynecology | Admitting: Obstetrics and Gynecology

## 2013-03-05 ENCOUNTER — Other Ambulatory Visit: Payer: Self-pay | Admitting: Obstetrics and Gynecology

## 2013-03-05 ENCOUNTER — Ambulatory Visit (HOSPITAL_COMMUNITY): Admit: 2013-03-05 | Payer: BC Managed Care – PPO

## 2013-03-05 DIAGNOSIS — IMO0002 Reserved for concepts with insufficient information to code with codable children: Secondary | ICD-10-CM | POA: Insufficient documentation

## 2013-03-05 DIAGNOSIS — O09519 Supervision of elderly primigravida, unspecified trimester: Secondary | ICD-10-CM | POA: Insufficient documentation

## 2013-03-08 ENCOUNTER — Other Ambulatory Visit: Payer: Self-pay | Admitting: Obstetrics and Gynecology

## 2013-03-08 ENCOUNTER — Ambulatory Visit (HOSPITAL_COMMUNITY)
Admission: RE | Admit: 2013-03-08 | Discharge: 2013-03-08 | Disposition: A | Discharge status: Home / Self Care | Payer: BC Managed Care – PPO | Source: Ambulatory Visit | Attending: Obstetrics and Gynecology | Admitting: Obstetrics and Gynecology

## 2013-03-08 DIAGNOSIS — IMO0002 Reserved for concepts with insufficient information to code with codable children: Secondary | ICD-10-CM | POA: Insufficient documentation

## 2013-03-08 DIAGNOSIS — O30009 Twin pregnancy, unspecified number of placenta and unspecified number of amniotic sacs, unspecified trimester: Secondary | ICD-10-CM

## 2013-03-08 DIAGNOSIS — O358XX0 Maternal care for other (suspected) fetal abnormality and damage, not applicable or unspecified: Secondary | ICD-10-CM

## 2013-03-08 DIAGNOSIS — O09519 Supervision of elderly primigravida, unspecified trimester: Secondary | ICD-10-CM | POA: Insufficient documentation

## 2013-03-12 ENCOUNTER — Ambulatory Visit (HOSPITAL_COMMUNITY): Payer: BC Managed Care – PPO

## 2013-03-17 ENCOUNTER — Other Ambulatory Visit: Payer: Self-pay | Admitting: Obstetrics and Gynecology

## 2013-03-17 DIAGNOSIS — IMO0002 Reserved for concepts with insufficient information to code with codable children: Secondary | ICD-10-CM

## 2013-03-17 DIAGNOSIS — O30009 Twin pregnancy, unspecified number of placenta and unspecified number of amniotic sacs, unspecified trimester: Secondary | ICD-10-CM

## 2013-03-18 ENCOUNTER — Ambulatory Visit (HOSPITAL_COMMUNITY)
Admission: RE | Admit: 2013-03-18 | Discharge: 2013-03-18 | Disposition: A | Discharge status: Home / Self Care | Payer: BC Managed Care – PPO | Source: Ambulatory Visit | Attending: Obstetrics and Gynecology | Admitting: Obstetrics and Gynecology

## 2013-03-18 DIAGNOSIS — O30009 Twin pregnancy, unspecified number of placenta and unspecified number of amniotic sacs, unspecified trimester: Secondary | ICD-10-CM

## 2013-03-18 DIAGNOSIS — O09519 Supervision of elderly primigravida, unspecified trimester: Secondary | ICD-10-CM | POA: Insufficient documentation

## 2013-03-18 DIAGNOSIS — IMO0002 Reserved for concepts with insufficient information to code with codable children: Secondary | ICD-10-CM | POA: Insufficient documentation

## 2013-03-23 ENCOUNTER — Other Ambulatory Visit: Payer: Self-pay | Admitting: Obstetrics and Gynecology

## 2013-03-23 DIAGNOSIS — O30009 Twin pregnancy, unspecified number of placenta and unspecified number of amniotic sacs, unspecified trimester: Secondary | ICD-10-CM

## 2013-03-23 DIAGNOSIS — O09512 Supervision of elderly primigravida, second trimester: Secondary | ICD-10-CM

## 2013-03-23 DIAGNOSIS — IMO0002 Reserved for concepts with insufficient information to code with codable children: Secondary | ICD-10-CM

## 2013-03-25 ENCOUNTER — Ambulatory Visit (HOSPITAL_COMMUNITY): Payer: BC Managed Care – PPO

## 2013-03-31 ENCOUNTER — Ambulatory Visit (HOSPITAL_COMMUNITY): Payer: BC Managed Care – PPO

## 2013-04-08 NOTE — L&D Delivery Note (Signed)
Delivery Note        Called to BS to assess FHR tracing, at about 245am, Cervix was complete and vtx at +2 station, Began pushing, Variable decels noted, and increase in baseline, mod variability,  Pt changed positions (L tilt, R tilt), and variables began to become deeper. Dr [facility], Pt continued pushing well, and FHR tracing remained cat 2, overall reassuring. Dr Normand Sloopillard and [facility] FHR baseline continued to be at 170's, pt temp 100.5 axillary.     At 6:44 AM a non-viable female was delivered via Vaginal, Spontaneous Delivery (Presentation: vtx;  ).  APGAR: 0, 0 , ; weight: pending   Placenta status: Intact, Spontaneous. To pathology  Cord:  with the following complications: stillbirth.  Anesthesia: Epidural  Episiotomy: none Lacerations: 2nd degree Suture Repair: 3.0 vicryl rapide Est. Blood Loss (mL): 300    Scantlin, Girl Verlaine [[id number]][[id number]]  At 6:22 AM a viable female was delivered via Vaginal, Spontaneous Delivery (Presentation: ; Occiput Anterior).  APGAR: 8, 9; weight: pending  Placenta status: Intact, Spontaneous. To pathology  Cord: 3 vessels with the following complications: None.short Cord ph, arterial 7.255, venous 7.278   Anesthesia: Epidural  Episiotomy: None Lacerations: 2nd degree;Perineal Suture Repair: 3.0 vicryl Est. Blood Loss (mL): 300   Completed repair while awaiting delivery of placenta,  After 2nd degree laceration repaired, noted to have continuous trickle, fundus was firm and at U, continued to observe and was unable to determine if there was a cervical laceration  Dr Su Hiltoberts [facility], hemostasis was noted.  No active bleeding from upper vagina at time or evaluation so unlikely cervical laceration.  However the introitus is oozing and needs to be reapproximated better.  This was done with 3-0 vicryl and bleeding was improved after completion of repair.  Instructions given.   Mom to postpartum.    Baby A to Couplet care / Skin [facility] Skin.   [facility]  Routine PP orders Placenta to pathology   LILLARD,SHELLEY M 05/05/2013, 8:48 AM

## 2013-04-09 ENCOUNTER — Ambulatory Visit (HOSPITAL_COMMUNITY): Payer: BC Managed Care – PPO

## 2013-04-15 ENCOUNTER — Ambulatory Visit (HOSPITAL_COMMUNITY): Payer: BC Managed Care – PPO

## 2013-04-23 ENCOUNTER — Encounter (HOSPITAL_COMMUNITY): Payer: Self-pay | Admitting: *Deleted

## 2013-04-23 ENCOUNTER — Telehealth (HOSPITAL_COMMUNITY): Payer: Self-pay | Admitting: *Deleted

## 2013-04-23 NOTE — Telephone Encounter (Signed)
Preadmission screen  

## 2013-04-28 LAB — OB RESULTS CONSOLE GBS: GBS: NEGATIVE

## 2013-05-03 ENCOUNTER — Inpatient Hospital Stay (HOSPITAL_COMMUNITY): Payer: BC Managed Care – PPO

## 2013-05-03 DIAGNOSIS — O09519 Supervision of elderly primigravida, unspecified trimester: Secondary | ICD-10-CM | POA: Insufficient documentation

## 2013-05-03 NOTE — H&P (Signed)
Sue Gibson is a 38 y.o. female, G1P0 at 3637 1/7 weeks, presenting on 05/04/13 for induction due to hx twin pregnancy with death of Twin B at 7225 4/7 weeks due to twin/twin transfusion.  Induction recommendation made by MFM.  Patient Active Problem List   Diagnosis Date Noted  . 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 Fetal demise, greater than 22 weeks, antepartum 02/16/2013  . 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   EDC of 05/24/13 was established by US at 11 weeks.   Anatomy scan:  19 4/7 weeks, with normal findings, possible marginal cord insertion for Twin A, females,  and both posterior placentas, normal fluid.    Additional US evaluations:   25 4/7 weeks--IUFD Twin B, measuring only 19 5/7 weeks, with Twin A having polyhydramnios, no hydrops.  Twin B noted to have oligohydramnios, with appearance of "stuck twin").  Cervix closed.  Elevated MCA Twin A.   Multiple US from that point, with BPPs and doppler flow studies all remaining WNL Last US on 04/28/13:  35 2/7 weeks, 5+11, 36%ile, AFI WNL, AC 1 week behind gestational age.  BPP 8/8, vtx, posterior placenta.  Significant prenatal events:  [facility] on day of dx of TTS and Twin B IUFD.  Received betamethasone course during that hospitalization.  Had BPP/UA and MCA dopplers evaluated frequently, with Twin A demonstrating reassuring FHR and appropriate growth. D/C'd on 11/24, with continuation of intensive antenatal testing, including doppler studies.  By 03/21/13, per MFM recommendation, she was monitored with weekly BPPs, which remained WNL.  3 hour GTT WNL.  Received TDaP 03/22/13. Last evaluation:  04/28/13--cervix closed, 50%, vtx, -3, per office note.   OB History   Grav Para Term Preterm Abortions TAB SAB Ect Mult Living   1         0      Past Medical History  Diagnosis Date  . Medical history non-contributory    Past Surgical History  Procedure Laterality Date  . Wisdom tooth extraction     Family History: family history includes Hypertension in her brother and father.; Sister anemia; Maternal Uncle diabetes, kidney disease  Social History:  reports that she has never smoked. She has never used smokeless tobacco. She reports that she does not drink alcohol or use illicit drugs.  She is of Asian descent, is a Engineer, maintenance (IT)college graduate, and has been employed as a Hydrographic surveyorcare center representative.   Prenatal Transfer Tool  Maternal Diabetes: No Genetic Screening: Normal 1st trimester screen and AFP Maternal Ultrasounds/Referrals: Abnormal:  Findings:   Other:Twin/Twin transfusion, with IUFD Twin B at 25 4/7 weeks, polyhydraminos of Twin A, which resolved to normal fluid. Fetal Ultrasounds or other Referrals:  Referred to Materal Fetal Medicine --followed by MFM Maternal Substance Abuse:  No Significant Maternal Medications:  None Significant Maternal Lab Results: Lab values include: Group B Strep negative  ROS:  + FM  Allergies  Allergen Reactions  . Kiwi Extract     Acidity causes bleeding of tongue, lips and gums  .  Marland Kitchen. Pineapple     Bleeding of gums, lips, tongue      Last menstrual period 08/17/2012.  Chest clear Heart RRR without murmur Abd gravid, NT, FH 38 cm Pelvic: from office visit 04/28/13, closed, 50%, vtx, -3 Ext: WNL  FHR: BPP 8/8 on  1/21 UCs:  Irregular per patient  Prenatal labs: ABO, Rh: --/--/B POS (11/22 1725) Antibody: NEG (11/22 1725) Rubella:    Immune RPR:   NR HBsAg:   Neg HIV:   NR GBS:  Negative Sickle cell/Hgb electrophoresis:  AA Pap:  08/2012 GC:  Negative 11/02/12 Chlamydia:  Negative 11/02/12 Genetic screenings:  Normal 1st trimester screen and AFP Glucola:  Elevated 1 hour GTT, normal 3 hour   Assessment/Plan: IUP at 37 1/7 weeks Hx TTS, with IUFD of Twin B at 25 4/7  weeks GBS negative  Plan: Admit to [facility] per consult with Dr. Estanislado Pandy Routine [facility] orders Nurses will check patient on admission and consult with Dr. Estanislado Pandy regarding plan of care for induction.  Nyra Capes, [place] 05/03/2013, 9:14 PM

## 2013-05-04 ENCOUNTER — Other Ambulatory Visit (HOSPITAL_COMMUNITY): Payer: Self-pay | Admitting: Obstetrics and Gynecology

## 2013-05-04 ENCOUNTER — Encounter (HOSPITAL_COMMUNITY): Payer: BC Managed Care – PPO | Admitting: Anesthesiology

## 2013-05-04 ENCOUNTER — Inpatient Hospital Stay (HOSPITAL_COMMUNITY): Payer: BC Managed Care – PPO | Admitting: Anesthesiology

## 2013-05-04 ENCOUNTER — Inpatient Hospital Stay (HOSPITAL_COMMUNITY)
Admission: RE | Admit: 2013-05-04 | Discharge: 2013-05-07 | DRG: 774 | Disposition: A | Discharge status: Home / Self Care | Payer: BC Managed Care – PPO | Source: Ambulatory Visit | Attending: Obstetrics and Gynecology | Admitting: Obstetrics and Gynecology

## 2013-05-04 ENCOUNTER — Encounter (HOSPITAL_COMMUNITY): Payer: Self-pay

## 2013-05-04 VITALS — BP 130/69 | HR 70 | Temp 97.9°F | Resp 18 | Ht 62.0 in | Wt 163.0 lb

## 2013-05-04 DIAGNOSIS — O30009 Twin pregnancy, unspecified number of placenta and unspecified number of amniotic sacs, unspecified trimester: Secondary | ICD-10-CM | POA: Diagnosis present

## 2013-05-04 DIAGNOSIS — D649 Anemia, unspecified: Secondary | ICD-10-CM | POA: Diagnosis not present

## 2013-05-04 DIAGNOSIS — IMO0001 Reserved for inherently not codable concepts without codable children: Secondary | ICD-10-CM

## 2013-05-04 DIAGNOSIS — O09519 Supervision of elderly primigravida, unspecified trimester: Secondary | ICD-10-CM

## 2013-05-04 DIAGNOSIS — O409XX0 Polyhydramnios, unspecified trimester, not applicable or unspecified: Secondary | ICD-10-CM | POA: Diagnosis present

## 2013-05-04 DIAGNOSIS — O364XX0 Maternal care for intrauterine death, not applicable or unspecified: Secondary | ICD-10-CM | POA: Diagnosis present

## 2013-05-04 DIAGNOSIS — O30039 Twin pregnancy, monochorionic/diamniotic, unspecified trimester: Secondary | ICD-10-CM

## 2013-05-04 DIAGNOSIS — O368990 Maternal care for other specified fetal problems, unspecified trimester, not applicable or unspecified: Principal | ICD-10-CM | POA: Diagnosis present

## 2013-05-04 DIAGNOSIS — O9903 Anemia complicating the puerperium: Secondary | ICD-10-CM | POA: Diagnosis not present

## 2013-05-04 LAB — RPR: RPR Ser Ql: NONREACTIVE

## 2013-05-04 LAB — CBC
HCT: 35.7 % — ABNORMAL LOW (ref 36.0–46.0)
Hemoglobin: 12 g/dL (ref 12.0–15.0)
MCH: 31.6 pg (ref 26.0–34.0)
MCHC: 33.6 g/dL (ref 30.0–36.0)
MCV: 93.9 fL (ref 78.0–100.0)
PLATELETS: 218 10*3/uL (ref 150–400)
RBC: 3.8 MIL/uL — ABNORMAL LOW (ref 3.87–5.11)
RDW: 13.3 % (ref 11.5–15.5)
WBC: 8.6 10*3/uL (ref 4.0–10.5)

## 2013-05-04 LAB — TYPE AND SCREEN
ABO/RH(D): B POS
Antibody Screen: NEGATIVE

## 2013-05-04 MED ORDER — IBUPROFEN 600 MG PO TABS: 600.0000 mg | ORAL_TABLET | Freq: Four times a day (QID) | ORAL | Status: DC | PRN

## 2013-05-04 MED ORDER — OXYTOCIN 40 UNITS IN LACTATED RINGERS INFUSION - SIMPLE MED
1.0000 m[IU]/min | INTRAVENOUS | Status: DC
  Administered 2013-05-04: 2 m[IU]/min via INTRAVENOUS
  Filled 2013-05-04: qty 1000

## 2013-05-04 MED ORDER — EPHEDRINE 5 MG/ML INJ
10.0000 mg | INTRAVENOUS | Status: DC | PRN
  Filled 2013-05-04: qty 4

## 2013-05-04 MED ORDER — LACTATED RINGERS IV SOLN
500.0000 mL | Freq: Once | INTRAVENOUS | Status: AC
  Administered 2013-05-04: 500 mL via INTRAVENOUS

## 2013-05-04 MED ORDER — LACTATED RINGERS IV SOLN
INTRAVENOUS | Status: DC
  Administered 2013-05-04: 09:00:00 via INTRAVENOUS

## 2013-05-04 MED ORDER — CITRIC ACID-SODIUM CITRATE 334-500 MG/5ML PO SOLN: 30.0000 mL | ORAL | Status: DC | PRN

## 2013-05-04 MED ORDER — ACETAMINOPHEN 325 MG PO TABS
650.0000 mg | ORAL_TABLET | ORAL | Status: DC | PRN
Start: 2013-05-04 — End: 2013-05-05
  Administered 2013-05-05: 650 mg via ORAL
  Filled 2013-05-04: qty 2

## 2013-05-04 MED ORDER — MISOPROSTOL 25 MCG QUARTER TABLET
25.0000 ug | ORAL_TABLET | ORAL | Status: DC | PRN
  Administered 2013-05-04: 25 ug via VAGINAL
  Filled 2013-05-04 (×2): qty 0.25

## 2013-05-04 MED ORDER — OXYTOCIN BOLUS FROM INFUSION
500.0000 mL | INTRAVENOUS | Status: DC
  Administered 2013-05-05: 500 mL via INTRAVENOUS

## 2013-05-04 MED ORDER — EPHEDRINE 5 MG/ML INJ: 10.0000 mg | INTRAVENOUS | Status: DC | PRN

## 2013-05-04 MED ORDER — FENTANYL 2.5 MCG/ML BUPIVACAINE 1/10 % EPIDURAL INFUSION (WH - ANES)
INTRAMUSCULAR | Status: DC | PRN
  Administered 2013-05-04: 14 mL/h via EPIDURAL

## 2013-05-04 MED ORDER — LACTATED RINGERS IV SOLN: 500.0000 mL | INTRAVENOUS | Status: DC | PRN

## 2013-05-04 MED ORDER — TERBUTALINE SULFATE 1 MG/ML IJ SOLN: 0.2500 mg | Freq: Once | INTRAMUSCULAR | Status: AC | PRN

## 2013-05-04 MED ORDER — PHENYLEPHRINE 40 MCG/ML (10ML) SYRINGE FOR IV PUSH (FOR BLOOD PRESSURE SUPPORT)
80.0000 ug | PREFILLED_SYRINGE | INTRAVENOUS | Status: DC | PRN
  Filled 2013-05-04: qty 10

## 2013-05-04 MED ORDER — OXYCODONE-ACETAMINOPHEN 5-325 MG PO TABS: 1.0000 | ORAL_TABLET | ORAL | Status: DC | PRN

## 2013-05-04 MED ORDER — LIDOCAINE HCL (PF) 1 % IJ SOLN
INTRAMUSCULAR | Status: DC | PRN
  Administered 2013-05-04 (×2): 9 mL

## 2013-05-04 MED ORDER — DIPHENHYDRAMINE HCL 50 MG/ML IJ SOLN: 12.5000 mg | INTRAMUSCULAR | Status: DC | PRN

## 2013-05-04 MED ORDER — FENTANYL 2.5 MCG/ML BUPIVACAINE 1/10 % EPIDURAL INFUSION (WH - ANES)
14.0000 mL/h | INTRAMUSCULAR | Status: DC | PRN
  Filled 2013-05-04: qty 125

## 2013-05-04 MED ORDER — LIDOCAINE HCL (PF) 1 % IJ SOLN
30.0000 mL | INTRAMUSCULAR | Status: DC | PRN
  Filled 2013-05-04: qty 30

## 2013-05-04 MED ORDER — ONDANSETRON HCL 4 MG/2ML IJ SOLN: 4.0000 mg | Freq: Four times a day (QID) | INTRAMUSCULAR | Status: DC | PRN

## 2013-05-04 MED ORDER — OXYTOCIN 40 UNITS IN LACTATED RINGERS INFUSION - SIMPLE MED: 62.5000 mL/h | INTRAVENOUS | Status: DC

## 2013-05-04 MED ORDER — PHENYLEPHRINE 40 MCG/ML (10ML) SYRINGE FOR IV PUSH (FOR BLOOD PRESSURE SUPPORT): 80.0000 ug | PREFILLED_SYRINGE | INTRAVENOUS | Status: DC | PRN

## 2013-05-04 NOTE — Progress Notes (Signed)
05/04/13 1200  Clinical Encounter Type  Visited With Patient and family together (husband and father)  Visit Type Spiritual support;Social support  Referral From Social work Nobie Putnam(TEdra Slade, Johnson & JohnsonLCSW)  Spiritual Encounters  Spiritual Needs Emotional   Pt, who goes by "Sue Gibson," and her husband, who introduced himself as "[name]," were in good spirits as they begin induction.  Per Sue Gibson, her pregnancy has proceeded well since her ante stay, and baby [name] appears to be thriving.  Parents are eager to meet [facility].  Per Jonny RuizJohn, "these things in life just happen; it's nothing you can control." This understanding has helped him cope with the loss of Allie and to prepare for delivery today.  Family is aware of ongoing chaplain availability; will refer to tomorrow's chaplain for follow-up care, but please also page as needs arise:  [phone].  Thank you.  [address]Chaplain Jovi Alvizo [place] Creek[place]unde[place], [place] [phone]

## 2013-05-04 NOTE — Anesthesia Preprocedure Evaluation (Signed)
Anesthesia Evaluation  Patient identified by MRN, date of birth, ID band Patient awake    Reviewed: Allergy & Precautions, H&P , NPO status , Patient's Chart, lab work & pertinent test results  Airway Mallampati: II TM Distance: >3 FB Neck ROM: full    Dental no notable dental hx.    Pulmonary neg pulmonary ROS,    Pulmonary exam normal       Cardiovascular negative cardio ROS      Neuro/Psych negative neurological ROS  negative psych ROS   GI/Hepatic negative GI ROS, Neg liver ROS,   Endo/Other  negative endocrine ROS  Renal/GU negative Renal ROS     Musculoskeletal   Abdominal Normal abdominal exam  (+)   Peds  Hematology negative hematology ROS (+)   Anesthesia Other Findings   Reproductive/Obstetrics (+) Pregnancy                           Anesthesia Physical Anesthesia Plan  ASA: II  Anesthesia Plan: Epidural   Post-op Pain Management:    Induction:   Airway Management Planned:   Additional Equipment:   Intra-op Plan:   Post-operative Plan:   Informed Consent: I have reviewed the patients History and Physical, chart, labs and discussed the procedure including the risks, benefits and alternatives for the proposed anesthesia with the patient or authorized representative who has indicated his/her understanding and acceptance.     Plan Discussed with:   Anesthesia Plan Comments:         Anesthesia Quick Evaluation  

## 2013-05-04 NOTE — Progress Notes (Signed)
Duru Margarita SermonsBouakeo is a 38 y.o. G1P0 at 8437w1dadmitted for induction of labor due to Twin pregnancy complicated by TTTS and demise of twinB.  Subjective:  Comfortable with  Labor support without medications Contractions every 2-5 minutes, lasting 45-60 seconds, intensity 2-3/10    Objective: BP 117/63  Pulse 75  Temp(Src) 98.3 F (36.8 C) (Oral)  Resp 18  Ht 5\' 2"  (1.575 m)  Wt 163 lb (73.936 kg)  BMI 29.81 kg/m2  LMP 08/17/2012      FHT:  Category 1 SVE:   Dilation: 1+ Effacement (%): 90 Station: -1 vertex   Labs: Lab Results  Component Value Date   WBC 8.6 05/04/2013   HGB 12.0 05/04/2013   HCT 35.7* 05/04/2013   MCV 93.9 05/04/2013   PLT 218 05/04/2013    Assessment / Plan: Early labor Patient would like to avoid Pitocin. Will ambulate and recheck. Fetal Wellbeing: reassuring Anticipated MOD:  NSVD  Adyn Hoes A 05/04/2013, 4:11 PM

## 2013-05-04 NOTE — Anesthesia Procedure Notes (Signed)
Epidural Patient location during procedure: OB Start time: 05/04/2013 10:12 PM End time: 05/04/2013 10:16 PM  Staffing Anesthesiologist: Leilani AbleHATCHETT, Chrystian Cupples Performed by: anesthesiologist   Preanesthetic Checklist Completed: patient identified, surgical consent, pre-op evaluation, timeout performed, IV checked, risks and benefits discussed and monitors and equipment checked  Epidural Patient position: sitting Prep: site prepped and draped and DuraPrep Patient monitoring: continuous pulse ox and blood pressure Approach: midline Injection technique: LOR air  Needle:  Needle type: Tuohy  Needle gauge: 17 G Needle length: 9 cm and 9 Needle insertion depth: 4 cm Catheter type: closed end flexible Catheter size: 19 Gauge Catheter at skin depth: 9 cm Test dose: negative and Other  Assessment Sensory level: T9 Events: blood not aspirated, injection not painful, no injection resistance, negative IV test and no paresthesia  Additional Notes Reason for block:procedure for pain

## 2013-05-05 ENCOUNTER — Encounter (HOSPITAL_COMMUNITY): Payer: Self-pay

## 2013-05-05 LAB — GLUCOSE, CAPILLARY: Glucose-Capillary: 78 mg/dL (ref 70–99)

## 2013-05-05 MED ORDER — FLEET ENEMA 7-19 GM/118ML RE ENEM: 1.0000 | ENEMA | Freq: Every day | RECTAL | Status: DC | PRN

## 2013-05-05 MED ORDER — OXYCODONE-ACETAMINOPHEN 5-325 MG PO TABS: 1.0000 | ORAL_TABLET | ORAL | Status: DC | PRN

## 2013-05-05 MED ORDER — WITCH HAZEL-GLYCERIN EX PADS: 1.0000 "application " | MEDICATED_PAD | CUTANEOUS | Status: DC | PRN

## 2013-05-05 MED ORDER — DIBUCAINE 1 % RE OINT
1.0000 "application " | TOPICAL_OINTMENT | RECTAL | Status: DC | PRN
  Filled 2013-05-05: qty 28

## 2013-05-05 MED ORDER — BISACODYL 10 MG RE SUPP
10.0000 mg | Freq: Every day | RECTAL | Status: DC | PRN
  Filled 2013-05-05: qty 1

## 2013-05-05 MED ORDER — ZOLPIDEM TARTRATE 5 MG PO TABS: 5.0000 mg | ORAL_TABLET | Freq: Every evening | ORAL | Status: DC | PRN

## 2013-05-05 MED ORDER — SENNOSIDES-DOCUSATE SODIUM 8.6-50 MG PO TABS
2.0000 | ORAL_TABLET | ORAL | Status: DC
  Administered 2013-05-05 – 2013-05-07 (×2): 2 via ORAL
  Filled 2013-05-05 (×2): qty 2

## 2013-05-05 MED ORDER — MEASLES, MUMPS & RUBELLA VAC ~~LOC~~ INJ
0.5000 mL | INJECTION | Freq: Once | SUBCUTANEOUS | Status: DC
  Filled 2013-05-05: qty 0.5

## 2013-05-05 MED ORDER — PRENATAL MULTIVITAMIN CH
1.0000 | ORAL_TABLET | Freq: Every day | ORAL | Status: DC
  Administered 2013-05-06 – 2013-05-07 (×2): 1 via ORAL
  Filled 2013-05-05 (×2): qty 1

## 2013-05-05 MED ORDER — BENZOCAINE-MENTHOL 20-0.5 % EX AERO
1.0000 "application " | INHALATION_SPRAY | CUTANEOUS | Status: DC | PRN
  Administered 2013-05-05: 1 via TOPICAL
  Filled 2013-05-05 (×2): qty 56

## 2013-05-05 MED ORDER — ONDANSETRON HCL 4 MG PO TABS: 4.0000 mg | ORAL_TABLET | ORAL | Status: DC | PRN

## 2013-05-05 MED ORDER — IBUPROFEN 600 MG PO TABS
600.0000 mg | ORAL_TABLET | Freq: Four times a day (QID) | ORAL | Status: DC
  Administered 2013-05-05 – 2013-05-07 (×9): 600 mg via ORAL
  Filled 2013-05-05 (×9): qty 1

## 2013-05-05 MED ORDER — TETANUS-DIPHTH-ACELL PERTUSSIS 5-2.5-18.5 LF-MCG/0.5 IM SUSP: 0.5000 mL | Freq: Once | INTRAMUSCULAR | Status: DC

## 2013-05-05 MED ORDER — MEDROXYPROGESTERONE ACETATE 150 MG/ML IM SUSP: 150.0000 mg | INTRAMUSCULAR | Status: DC | PRN

## 2013-05-05 MED ORDER — SIMETHICONE 80 MG PO CHEW: 80.0000 mg | CHEWABLE_TABLET | ORAL | Status: DC | PRN

## 2013-05-05 MED ORDER — LANOLIN HYDROUS EX OINT: TOPICAL_OINTMENT | CUTANEOUS | Status: DC | PRN

## 2013-05-05 MED ORDER — DIPHENHYDRAMINE HCL 25 MG PO CAPS: 25.0000 mg | ORAL_CAPSULE | Freq: Four times a day (QID) | ORAL | Status: DC | PRN

## 2013-05-05 MED ORDER — ONDANSETRON HCL 4 MG/2ML IJ SOLN: 4.0000 mg | INTRAMUSCULAR | Status: DC | PRN

## 2013-05-05 NOTE — Progress Notes (Signed)
This was a follow up visit with Candise BowensJen (Namiyah) and her family.  She was grateful that baby [name] was here and safe and she is grateful.  She was tired and so the conversation was short, but she appreciated the support.  [address]Chaplain Katy Bethany Beachlaussen Pager, [phone] 4:43 PM   05/05/13 1600  Clinical Encounter Type  Visited With Patient;Patient and family together  Visit Type Follow-up;Spiritual support  Referral From Chaplain

## 2013-05-05 NOTE — Progress Notes (Signed)
Infant for cremation  [facility] [address] [place]   [phone]

## 2013-05-05 NOTE — Lactation Note (Signed)
This note was copied from the chart of Sue Jaclynne Czerwonka. Lactation Consultation Note Initial consult:  Baby Sue 7 hours old and sleeping STS on mom's chest.  Mother has short shaft nipples.  Reviewed hand expression with mom and a small drop of colostrum expressed.  Reviewed cluster, cue based feeding, lactation support services and brochure.  Reviewed that with a late preterm newborn we will start DEBP later on this evening or in the morning, time depending on how the baby latches.  Encouraged mother to call for assistance with the next feedings.   Patient Name: Sue Gibson Today's Date: 05/05/2013 Reason for consult: Initial assessment   Maternal Data Infant to breast within first hour of birth: Yes Has patient been taught Hand Expression?: Yes Does the patient have breastfeeding experience prior to this delivery?: No  Feeding Feeding Type: Breast Fed Length of feed: 5 min  LATCH Score/Interventions Latch: Too sleepy or reluctant, no latch achieved, no sucking elicited. Intervention(s): Skin to skin;Teach feeding cues  Audible Swallowing: None  Type of Nipple: Flat Intervention(s): Reverse pressure  Comfort (Breast/Nipple): Soft / non-tender     Hold (Positioning): Assistance needed to correctly position infant at breast and maintain latch.  LATCH Score: 4  Lactation Tools Discussed/Used     Consult Status Consult Status: Follow-up Date: 05/05/13 Follow-up type: In-patient    Dahlia ByesBerkelhammer, [place] Sara [facility] 05/05/2013, 1:47 PM

## 2013-05-05 NOTE — Anesthesia Postprocedure Evaluation (Signed)
  Anesthesia Post-op Note  Patient: Sue Gibson  Procedure(s) Performed: * No procedures listed *  Patient Location: Mother/Baby  Anesthesia Type:Epidural  Level of Consciousness: awake  Airway and Oxygen Therapy: Patient Spontanous Breathing  Post-op Pain: mild  Post-op Assessment: Patient's Cardiovascular Status Stable and Respiratory Function Stable  Post-op Vital Signs: stable  Complications: No apparent anesthesia complications

## 2013-05-05 NOTE — Lactation Note (Signed)
This note was copied from the chart of Sue Tranice Csaszar. Lactation Consultation Note Follow up consult:  Baby Sue 8 hours old, mother has short shaft nipples.  Assisted baby in football hold.  Baby opened mouth but no rhythmical sucking observed, baby still sleepy.  LC checked suck and is still somewhat disorganized, very little sucking elicited. Set up DEBP for mom to start post pumping after baby is 12 hours old.  Reviewed breast pump parts care and suggested mom prepump briefly to help evert nipples before breastfeeding.  Encouraged mom to call for further assistance.   Patient Name: Sue Gibson Today's Date: 05/05/2013 Reason for consult: Follow-up assessment   Maternal Data Infant to breast within first hour of birth: Yes Has patient been taught Hand Expression?: Yes Does the patient have breastfeeding experience prior to this delivery?: No  Feeding Feeding Type: Breast Fed Length of feed: 5 min  LATCH Score/Interventions Latch: Too sleepy or reluctant, no latch achieved, no sucking elicited. Intervention(s): Skin to skin;Teach feeding cues;Waking techniques  Audible Swallowing: None  Type of Nipple: Flat (semi flat, evert with stimluation) Intervention(s): Double electric pump  Comfort (Breast/Nipple): Soft / non-tender     Hold (Positioning): Assistance needed to correctly position infant at breast and maintain latch.  LATCH Score: 4  Lactation Tools Discussed/Used WIC Program: No Pump Review: Setup, frequency, and cleaning Initiated by:: Dahlia Byesuth Berkelhammer RN Date initiated:: 05/05/13   Consult Status Consult Status: Follow-up Date: 05/06/13 Follow-up type: In-patient    Dahlia ByesBerkelhammer, Ruth [name] 05/05/2013, 2:59 PM

## 2013-05-05 NOTE — Progress Notes (Signed)
Awaiting on Dr Su Hiltoberts to come assess bleeding and tears

## 2013-05-06 LAB — CBC
HCT: 30 % — ABNORMAL LOW (ref 36.0–46.0)
HEMOGLOBIN: 10.1 g/dL — AB (ref 12.0–15.0)
MCH: 31.7 pg (ref 26.0–34.0)
MCHC: 33.7 g/dL (ref 30.0–36.0)
MCV: 94 fL (ref 78.0–100.0)
Platelets: 159 10*3/uL (ref 150–400)
RBC: 3.19 MIL/uL — AB (ref 3.87–5.11)
RDW: 13.3 % (ref 11.5–15.5)
WBC: 11 10*3/uL — ABNORMAL HIGH (ref 4.0–10.5)

## 2013-05-06 NOTE — Progress Notes (Signed)
Post Partum Day 1 Subjective: no complaints, up ad lib, voiding and tolerating PO  Objective: Blood pressure 108/71, pulse 68, temperature 98.6 F (37 C), temperature source Oral, resp. rate 18, height 5\' 2"  (1.575 m), weight 163 lb (73.936 kg), last menstrual period 08/17/2012, SpO2 98.00%, unknown if currently breastfeeding.  Physical Exam:  General: alert and cooperative Lochia: appropriate Uterine Fundus: firm Incision: na DVT Evaluation: No evidence of DVT seen on physical exam.   Recent Labs  05/04/13 0835 05/06/13 0640  HGB 12.0 10.1*  HCT 35.7* 30.0*    Assessment/Plan: Plan for discharge tomorrow and Breastfeeding Pt doing well continue current care   LOS: 2 days   Phylis Javed A 05/06/2013, 11:44 AM

## 2013-05-06 NOTE — Progress Notes (Signed)
05/06/13 1600  Clinical Encounter Type  Visited With Patient and family together (pt's dad present)  Visit Type Follow-up;Spiritual support;Social support  Spiritual Encounters  Spiritual Needs Emotional  Stress Factors  Patient Stress Factors Major life changes   Followed up with Candise BowensJen (Sonal) to provide further support after delivery.  She appreciated how staff supported her, her husband, and her sister in honoring their preferences for caring for, seeing/holding (or not), and having photos made of/with Gerarda GuntherAllie and [name].  Provided reflective listening, support, and affirmation of the meaningful and multilayered journey that their family is experiencing.   [address]Chaplain Karelyn Brisby [place], [place] [phone]

## 2013-05-07 DIAGNOSIS — D649 Anemia, unspecified: Secondary | ICD-10-CM | POA: Diagnosis present

## 2013-05-07 MED ORDER — OXYCODONE-ACETAMINOPHEN 5-325 MG PO TABS: 1.0000 | ORAL_TABLET | ORAL | Status: AC | PRN

## 2013-05-07 MED ORDER — IBUPROFEN 600 MG PO TABS: 600.0000 mg | ORAL_TABLET | Freq: Four times a day (QID) | ORAL | Status: AC

## 2013-05-07 NOTE — Discharge Summary (Signed)
Vaginal Delivery Discharge Summary  Sue Gibson  DOB:    1976/01/07 MRN:    [id number] CSN:    [id number]  Date of admission:                  05/04/2013  Date of discharge:                   05/07/2013  Procedures this admission: SVD w/ viable female twin A and IUFD of twin B.  2nd degree vaginal laceration with repair.   Date of Delivery: 05/05/2013  Newborn Data:    Sue, Gibson [[id number]]  Live born female  Birth Weight: 6.3 oz (179 g) APGAR: 0, 0   Gibson, Sue Sue [[id number]]  Live born female  Birth Weight: 5 lb 3.4 oz (2364 g) APGAR: 8, 9  Home with mother. Circumcision Plan: N/A  History of Present Illness:  Ms. Sue Gibson is a 38 y.o. female, G1P1001, who presents at [redacted]w[redacted]d weeks gestation. The patient has been followed at the [facility] and Gynecology division of [facility]. She was admitted induction of labor. Her pregnancy has been complicated by:  Patient Active Problem List   Diagnosis Date Noted  . Anemia 05/07/2013  . 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 Fetal demise, greater than 22 weeks, antepartum 02/16/2013  . 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   Her labor was not complicated. She proceeded to have a vaginal delivery of a healthy infant. Her delivery was not complicated. Her postpartum course was not complicated.  She was discharged to home on postpartum day 2 doing well.  Feeding:  breast  Contraception:  Undecided  Discharge hemoglobin:  Hemoglobin  Date Value Range Status  05/06/2013 10.1* 12.0 - 15.0 g/dL Final     HCT  Date Value Range Status  05/06/2013 30.0* 36.0 - 46.0 % Final    Discharge Physical Exam:   General: alert and cooperative Lochia: appropriate Uterine Fundus: firm Incision: healing well DVT Evaluation:  No evidence of DVT seen on physical exam. Negative Homan's sign.  Intrapartum Procedures: spontaneous vaginal delivery Postpartum Procedures: none Complications-Operative and Postpartum: 2nd degree perineal laceration  Discharge Diagnoses: Term Pregnancy-delivered Viable twin A, IUFD of twin B due to twin to twin transfusion. Mild asymptomatic anemia.  Discharge Information:  Activity:           pelvic rest Diet:                routine Medications: Ibuprofen and Percocet PNV, Condition:      stable Instructions:   Postpartum Care After Vaginal Delivery  After you deliver your newborn (postpartum period), the usual stay in the hospital is 24 72 hours. If there were problems with your labor or delivery, or if you have other medical problems, you might be in the hospital longer.  While you are in the hospital, you will receive help and instructions on how to care for yourself and your newborn during the postpartum period.  While you are in the hospital:  Be sure to tell your nurses if you have pain or discomfort, as well as where you feel the pain and what makes the pain worse.  If you had an incision made near your vagina (episiotomy) or if you had some tearing during delivery, the nurses may put ice packs on  your episiotomy or tear. The ice packs may help to reduce the pain and swelling.  If you are breastfeeding, you may feel uncomfortable contractions of your uterus for a couple of weeks. This is normal. The contractions help your uterus get back to normal size.  It is normal to have some bleeding after delivery.  For the first 1 3 days after delivery, the flow is red and the amount may be similar to a period.  It is common for the flow to start and stop.  In the first few days, you may pass some small clots. Let your nurses know if you begin to pass large clots or your flow increases.  Do not  flush blood clots down the toilet before having the nurse look at them.  During the  next 3 10 days after delivery, your flow should become more watery and pink or brown-tinged in color.  Ten to fourteen days after delivery, your flow should be a small amount of yellowish-white discharge.  The amount of your flow will decrease over the first few weeks after delivery. Your flow may stop in 6 8 weeks. Most women have had their flow stop by 12 weeks after delivery.  You should change your sanitary pads frequently.  Wash your hands thoroughly with soap and water for at least 20 seconds after changing pads, using the toilet, or before holding or feeding your newborn.  You should feel like you need to empty your bladder within the first 6 8 hours after delivery.  In case you become weak, lightheaded, or faint, call your nurse before you get out of bed for the first time and before you take a shower for the first time.  Within the first few days after delivery, your breasts may begin to feel tender and full. This is called engorgement. Breast tenderness usually goes away within 48 72 hours after engorgement occurs. You may also notice milk leaking from your breasts. If you are not breastfeeding, do not stimulate your breasts. Breast stimulation can make your breasts produce more milk.  Spending as much time as possible with your newborn is very important. During this time, you and your newborn can feel close and get to know each other. Having your newborn stay in your room (rooming in) will help to strengthen the bond with your newborn. It will give you time to get to know your newborn and become comfortable caring for your newborn.  Your hormones change after delivery. Sometimes the hormone changes can temporarily cause you to feel sad or tearful. These feelings should not last more than a few days. If these feelings last longer than that, you should talk to your caregiver.  If desired, talk to your caregiver about methods of family planning or contraception.  Talk to your caregiver  about immunizations. Your caregiver may want you to have the following immunizations before leaving the hospital:  Tetanus, diphtheria, and pertussis (Tdap) or tetanus and diphtheria (Td) immunization. It is very important that you and your family (including grandparents) or others caring for your newborn are up-to-date with the Tdap or Td immunizations. The Tdap or Td immunization can help protect your newborn from getting ill.  Rubella immunization.  Varicella (chickenpox) immunization.  Influenza immunization. You should receive this annual immunization if you did not receive the immunization during your pregnancy. Document Released: 01/20/2007 Document Revised: 12/18/2011 Document Reviewed: 11/20/2011 [facility] Patient Information 2014 [place], [place].   Postpartum Depression and Baby Blues  The postpartum period begins  right after the birth of a baby. During this time, there is often a great amount of joy and excitement. It is also a time of considerable changes in the life of the parent(s). Regardless of how many times a mother gives birth, each child brings new challenges and dynamics to the family. It is not unusual to have feelings of excitement accompanied by confusing shifts in moods, emotions, and thoughts. All mothers are at risk of developing postpartum depression or the "baby blues." These mood changes can occur right after giving birth, or they may occur many months after giving birth. The baby blues or postpartum depression can be mild or severe. Additionally, postpartum depression can resolve rather quickly, or it can be a long-term condition. CAUSES Elevated hormones and their rapid decline are thought to be a main cause of postpartum depression and the baby blues. There are a number of hormones that radically change during and after pregnancy. Estrogen and progesterone usually decrease immediately after delivering your baby. The level of thyroid hormone and various cortisol  steroids also rapidly drop. Other factors that play a major role in these changes include major life events and genetics.  RISK FACTORS If you have any of the following risks for the baby blues or postpartum depression, know what symptoms to watch out for during the postpartum period. Risk factors that may increase the likelihood of getting the baby blues or postpartum depression include:  Havinga personal or family history of depression.  Having depression while being pregnant.  Having premenstrual or oral contraceptive-associated mood issues.  Having exceptional life stress.  Having marital conflict.  Lacking a social support network.  Having a baby with special needs.  Having health problems such as diabetes. SYMPTOMS Baby blues symptoms include:  Brief fluctuations in mood, such as going from extreme happiness to sadness.  Decreased concentration.  Difficulty sleeping.  Crying spells, tearfulness.  Irritability.  Anxiety. Postpartum depression symptoms typically begin within the first month after giving birth. These symptoms include:  Difficulty sleeping or excessive sleepiness.  Marked weight loss.  Agitation.  Feelings of worthlessness.  Lack of interest in activity or food. Postpartum psychosis is a very concerning condition and can be dangerous. Fortunately, it is rare. Displaying any of the following symptoms is cause for immediate medical attention. Postpartum psychosis symptoms include:  Hallucinations and delusions.  Bizarre or disorganized behavior.  Confusion or disorientation. DIAGNOSIS  A diagnosis is made by an evaluation of your symptoms. There are no medical or lab tests that lead to a diagnosis, but there are various questionnaires that a caregiver may use to identify those with the baby blues, postpartum depression, or psychosis. Often times, a screening tool called the [place] Postnatal Depression Scale is used to diagnose depression in the  postpartum period.  TREATMENT The baby blues usually goes away on its own in 1 to 2 weeks. Social support is often all that is needed. You should be encouraged to get adequate sleep and rest. Occasionally, you may be given medicines to help you sleep.  Postpartum depression requires treatment as it can last several months or longer if it is not treated. Treatment may include individual or group therapy, medicine, or both to address any social, physiological, and psychological factors that may play a role in the depression. Regular exercise, a healthy diet, rest, and social support may also be strongly recommended.  Postpartum psychosis is more serious and needs treatment right away. Hospitalization is often needed. HOME CARE INSTRUCTIONS  Get as  much rest as you can. Nap when the baby sleeps.  Exercise regularly. Some women find yoga and walking to be beneficial.  Eat a balanced and nourishing diet.  Do little things that you enjoy. Have a cup of tea, take a bubble bath, read your favorite magazine, or listen to your favorite music.  Avoid alcohol.  Ask for help with household chores, cooking, grocery shopping, or running errands as needed. Do not try to do everything.  Talk to people close to you about how you are feeling. Get support from your partner, family members, friends, or other new moms.  Try to stay positive in how you think. Think about the things you are grateful for.  Do not spend a lot of time alone.  Only take medicine as directed by your caregiver.  Keep all your postpartum appointments.  Let your caregiver know if you have any concerns. SEEK MEDICAL CARE IF: You are having a reaction or problems with your medicine. SEEK IMMEDIATE MEDICAL CARE IF:  You have suicidal feelings.  You feel you may harm the baby or someone else. Document Released: 12/28/2003 Document Revised: 06/17/2011 Document Reviewed: 01/29/2011 [facility] Patient Information 2014 [place]   Discharge to: home  Follow-up Information   [facility]. Schedule an appointment as soon as possible for a visit in 6 weeks. (Please call if any questions or concerns. as needed)    Specialty:  Obstetrics and Gynecology   Contact information:   [address] [place] [id number] [phone]       Haroldine LawsOXLEY, Mariza Bourget 05/07/2013

## 2013-05-07 NOTE — Discharge Instructions (Signed)
Postpartum Care After Vaginal Delivery °After you deliver your newborn (postpartum period), the usual stay in the hospital is 24 72 hours. If there were problems with your labor or delivery, or if you have other medical problems, you might be in the hospital longer.  °While you are in the hospital, you will receive help and instructions on how to care for yourself and your newborn during the postpartum period.  °While you are in the hospital: °· Be sure to tell your nurses if you have pain or discomfort, as well as where you feel the pain and what makes the pain worse. °· If you had an incision made near your vagina (episiotomy) or if you had some tearing during delivery, the nurses may put ice packs on your episiotomy or tear. The ice packs may help to reduce the pain and swelling. °· If you are breastfeeding, you may feel uncomfortable contractions of your uterus for a couple of weeks. This is normal. The contractions help your uterus get back to normal size. °· It is normal to have some bleeding after delivery. °· For the first 1 3 days after delivery, the flow is red and the amount may be similar to a period. °· It is common for the flow to start and stop. °· In the first few days, you may pass some small clots. Let your nurses know if you begin to pass large clots or your flow increases. °· Do not  flush blood clots down the toilet before having the nurse look at them. °· During the next 3 10 days after delivery, your flow should become more watery and pink or brown-tinged in color. °· Ten to fourteen days after delivery, your flow should be a small amount of yellowish-white discharge. °· The amount of your flow will decrease over the first few weeks after delivery. Your flow may stop in 6 8 weeks. Most women have had their flow stop by 12 weeks after delivery. °· You should change your sanitary pads frequently. °· Wash your hands thoroughly with soap and water for at least 20 seconds after changing pads, using  the toilet, or before holding or feeding your newborn. °· You should feel like you need to empty your bladder within the first 6 8 hours after delivery. °· In case you become weak, lightheaded, or faint, call your nurse before you get out of bed for the first time and before you take a shower for the first time. °· Within the first few days after delivery, your breasts may begin to feel tender and full. This is called engorgement. Breast tenderness usually goes away within 48 72 hours after engorgement occurs. You may also notice milk leaking from your breasts. If you are not breastfeeding, do not stimulate your breasts. Breast stimulation can make your breasts produce more milk. °· Spending as much time as possible with your newborn is very important. During this time, you and your newborn can feel close and get to know each other. Having your newborn stay in your room (rooming in) will help to strengthen the bond with your newborn.  It will give you time to get to know your newborn and become comfortable caring for your newborn. °· Your hormones change after delivery. Sometimes the hormone changes can temporarily cause you to feel sad or tearful. These feelings should not last more than a few days. If these feelings last longer than that, you should talk to your caregiver. °· If desired, talk to your caregiver about   methods of family planning or contraception.  Talk to your caregiver about immunizations. Your caregiver may want you to have the following immunizations before leaving the hospital:  Tetanus, diphtheria, and pertussis (Tdap) or tetanus and diphtheria (Td) immunization. It is very important that you and your family (including grandparents) or others caring for your newborn are up-to-date with the Tdap or Td immunizations. The Tdap or Td immunization can help protect your newborn from getting ill.  Rubella immunization.  Varicella (chickenpox) immunization.  Influenza immunization. You should  receive this annual immunization if you did not receive the immunization during your pregnancy. Document Released: 01/20/2007 Document Revised: 12/18/2011 Document Reviewed: 11/20/2011 [facility] Patient Information 2014 [place].  Postpartum Depression and Baby Blues The postpartum period begins right after the birth of a baby. During this time, there is often a great amount of joy and excitement. It is also a time of considerable changes in the life of the parent(s). Regardless of how many times a mother gives birth, each child brings new challenges and dynamics to the family. It is not unusual to have feelings of excitement accompanied by confusing shifts in moods, emotions, and thoughts. All mothers are at risk of developing postpartum depression or the "baby blues." These mood changes can occur right after giving birth, or they may occur many months after giving birth. The baby blues or postpartum depression can be mild or severe. Additionally, postpartum depression can resolve rather quickly, or it can be a long-term condition. CAUSES Elevated hormones and their rapid decline are thought to be a main cause of postpartum depression and the baby blues. There are a number of hormones that radically change during and after pregnancy. Estrogen and progesterone usually decrease immediately after delivering your baby. The level of thyroid hormone and various cortisol steroids also rapidly drop. Other factors that play a major role in these changes include major life events and genetics.  RISK FACTORS If you have any of the following risks for the baby blues or postpartum depression, know what symptoms to watch out for during the postpartum period. Risk factors that may increase the likelihood of getting the baby blues or postpartum depression include:  Havinga personal or family history of depression.  Having depression while being pregnant.  Having premenstrual or oral contraceptive-associated  mood issues.  Having exceptional life stress.  Having marital conflict.  Lacking a social support network.  Having a baby with special needs.  Having health problems such as diabetes. SYMPTOMS Baby blues symptoms include:  Brief fluctuations in mood, such as going from extreme happiness to sadness.  Decreased concentration.  Difficulty sleeping.  Crying spells, tearfulness.  Irritability.  Anxiety. Postpartum depression symptoms typically begin within the first month after giving birth. These symptoms include:  Difficulty sleeping or excessive sleepiness.  Marked weight loss.  Agitation.  Feelings of worthlessness.  Lack of interest in activity or food. Postpartum psychosis is a very concerning condition and can be dangerous. Fortunately, it is rare. Displaying any of the following symptoms is cause for immediate medical attention. Postpartum psychosis symptoms include:  Hallucinations and delusions.  Bizarre or disorganized behavior.  Confusion or disorientation. DIAGNOSIS  A diagnosis is made by an evaluation of your symptoms. There are no medical or lab tests that lead to a diagnosis, but there are various questionnaires that a caregiver may use to identify those with the baby blues, postpartum depression, or psychosis. Often times, a screening tool called the [place] Postnatal Depression Scale is used to diagnose  depression in the postpartum period.  °TREATMENT °The baby blues usually goes away on its own in 1 to 2 weeks. Social support is often all that is needed. You should be encouraged to get adequate sleep and rest. Occasionally, you may be given medicines to help you sleep.  °Postpartum depression requires treatment as it can last several months or longer if it is not treated. Treatment may include individual or group therapy, medicine, or both to address any social, physiological, and psychological factors that may play a role in the depression. Regular  exercise, a healthy diet, rest, and social support may also be strongly recommended.  °Postpartum psychosis is more serious and needs treatment right away. Hospitalization is often needed. °HOME CARE INSTRUCTIONS °· Get as much rest as you can. Nap when the baby sleeps. °· Exercise regularly. Some women find yoga and walking to be beneficial. °· Eat a balanced and nourishing diet. °· Do little things that you enjoy. Have a cup of tea, take a bubble bath, read your favorite magazine, or listen to your favorite music. °· Avoid alcohol. °· Ask for help with household chores, cooking, grocery shopping, or running errands as needed. Do not try to do everything. °· Talk to people close to you about how you are feeling. Get support from your partner, family members, friends, or other new moms. °· Try to stay positive in how you think. Think about the things you are grateful for. °· Do not spend a lot of time alone. °· Only take medicine as directed by your caregiver. °· Keep all your postpartum appointments. °· Let your caregiver know if you have any concerns. °SEEK MEDICAL CARE IF: °You are having a reaction or problems with your medicine. °SEEK IMMEDIATE MEDICAL CARE IF: °· You have suicidal feelings. °· You feel you may harm the baby or someone else. °Document Released: 12/28/2003 Document Revised: 06/17/2011 Document Reviewed: 01/04/2013 °[employer]® Patient Information ©2014 [employer]. ° °Breastfeeding °Deciding to breastfeed is one of the best choices you can make for you and your baby. A change in hormones during pregnancy causes your breast tissue to grow and increases the number and size of your milk ducts. These hormones also allow proteins, sugars, and fats from your blood supply to make breast milk in your milk-producing glands. Hormones prevent breast milk from being released before your baby is born as well as prompt milk flow after birth. Once breastfeeding has begun, thoughts of your baby, as well as his  or her sucking or crying, can stimulate the release of milk from your milk-producing glands.  °BENEFITS OF BREASTFEEDING °For Your Baby °· Your first milk (colostrum) helps your baby's digestive system function better.   °· There are antibodies in your milk that help your baby fight off infections.   °· Your baby has a lower incidence of asthma, allergies, and sudden infant death syndrome.   °· The nutrients in breast milk are better for your baby than infant formulas and are designed uniquely for your baby's needs.   °· Breast milk improves your baby's brain development.   °· Your baby is less likely to develop other conditions, such as childhood obesity, asthma, or type 2 diabetes mellitus.   °For You  °· Breastfeeding helps to create a very special bond between you and your baby.   °· Breastfeeding is convenient. Breast milk is always available at the correct temperature and costs nothing.   °· Breastfeeding helps to burn calories and helps you lose the weight gained during pregnancy.   °· Breastfeeding makes   your uterus contract to its prepregnancy size faster and slows bleeding (lochia) after you give birth.   °· Breastfeeding helps to lower your risk of developing type 2 diabetes mellitus, osteoporosis, and breast or ovarian cancer later in life. °SIGNS THAT YOUR BABY IS HUNGRY °Early Signs of Hunger  °· Increased alertness or activity. °· Stretching. °· Movement of the head from side to side. °· Movement of the head and opening of the mouth when the corner of the mouth or cheek is stroked (rooting). °· Increased sucking sounds, smacking lips, cooing, sighing, or squeaking. °· Hand-to-mouth movements. °· Increased sucking of fingers or hands. °Late Signs of Hunger °· Fussing. °· Intermittent crying. °Extreme Signs of Hunger °Signs of extreme hunger will require calming and consoling before your baby will be able to breastfeed successfully. Do not wait for the following signs of extreme hunger to occur before  you initiate breastfeeding:   °· Restlessness. °· A loud, strong cry. °·  Screaming. °BREASTFEEDING BASICS °Breastfeeding Initiation °· Find a comfortable place to sit or lie down, with your neck and back well supported. °· Place a pillow or rolled up blanket under your baby to bring him or her to the level of your breast (if you are seated). Nursing pillows are specially designed to help support your arms and your baby while you breastfeed. °· Make sure that your baby's abdomen is facing your abdomen.   °· Gently massage your breast. With your fingertips, massage from your chest wall toward your nipple in a circular motion. This encourages milk flow. You may need to continue this action during the feeding if your milk flows slowly. °· Support your breast with 4 fingers underneath and your thumb above your nipple. Make sure your fingers are well away from your nipple and your baby's mouth.   °· Stroke your baby's lips gently with your finger or nipple.   °· When your baby's mouth is open wide enough, quickly bring your baby to your breast, placing your entire nipple and as much of the colored area around your nipple (areola) as possible into your baby's mouth.   °· More areola should be visible above your baby's upper lip than below the lower lip.   °· Your baby's tongue should be between his or her lower gum and your breast.   °· Ensure that your baby's mouth is correctly positioned around your nipple (latched). Your baby's lips should create a seal on your breast and be turned out (everted). °· It is common for your baby to suck about 2 3 minutes in order to start the flow of breast milk. °Latching °Teaching your baby how to latch on to your breast properly is very important. An improper latch can cause nipple pain and decreased milk supply for you and poor weight gain in your baby. Also, if your baby is not latched onto your nipple properly, he or she may swallow some air during feeding. This can make your baby  fussy. Burping your baby when you switch breasts during the feeding can help to get rid of the air. However, teaching your baby to latch on properly is still the best way to prevent fussiness from swallowing air while breastfeeding. °Signs that your baby has successfully latched on to your nipple:    °· Silent tugging or silent sucking, without causing you pain.   °· Swallowing heard between every 3 4 sucks.   °·  Muscle movement above and in front of his or her ears while sucking.   °Signs that your baby has not successfully latched on   to nipple:  °· Sucking sounds or smacking sounds from your baby while breastfeeding. °· Nipple pain. °If you think your baby has not latched on correctly, slip your finger into the corner of your baby's mouth to break the suction and place it between your baby's gums. Attempt breastfeeding initiation again. °Signs of Successful Breastfeeding °Signs from your baby:   °· A gradual decrease in the number of sucks or complete cessation of sucking.   °· Falling asleep.   °· Relaxation of his or her body.   °· Retention of a small amount of milk in his or her mouth.   °· Letting go of your breast by himself or herself. °Signs from you: °· Breasts that have increased in firmness, weight, and size 1 3 hours after feeding.   °· Breasts that are softer immediately after breastfeeding. °· Increased milk volume, as well as a change in milk consistency and color by the 5th day of breastfeeding.   °· Nipples that are not sore, cracked, or bleeding. °Signs That Your Baby is Getting Enough Milk °· Wetting at least 3 diapers in a 24-hour period. The urine should be clear and pale yellow by age 5 days. °· At least 3 stools in a 24-hour period by age 5 days. The stool should be soft and yellow. °· At least 3 stools in a 24-hour period by age 7 days. The stool should be seedy and yellow. °· No loss of weight greater than 10% of birth weight during the first 3 days of age. °· Average weight gain of 4 7  ounces (120 210 mL) per week after age 4 days. °· Consistent daily weight gain by age 5 days, without weight loss after the age of 2 weeks. °After a feeding, your baby may spit up a small amount. This is common. °BREASTFEEDING FREQUENCY AND DURATION °Frequent feeding will help you make more milk and can prevent sore nipples and breast engorgement. Breastfeed when you feel the need to reduce the fullness of your breasts or when your baby shows signs of hunger. This is called "breastfeeding on demand." Avoid introducing a pacifier to your baby while you are working to establish breastfeeding (the first 4 6 weeks after your baby is born). After this time you may choose to use a pacifier. Research has shown that pacifier use during the first year of a baby's life decreases the risk of sudden infant death syndrome (SIDS). °Allow your baby to feed on each breast as long as he or she wants. Breastfeed until your baby is finished feeding. When your baby unlatches or falls asleep while feeding from the first breast, offer the second breast. Because newborns are often sleepy in the first few weeks of life, you may need to awaken your baby to get him or her to feed. °Breastfeeding times will vary from baby to baby. However, the following rules can serve as a guide to help you ensure that your baby is properly fed: °· Newborns (babies 4 weeks of age or younger) may breastfeed every 1 3 hours. °· Newborns should not go longer than 3 hours during the day or 5 hours during the night without breastfeeding. °· You should breastfeed your baby a minimum of 8 times in a 24-hour period until you begin to introduce solid foods to your baby at around 6 months of age. °BREAST MILK PUMPING °Pumping and storing breast milk allows you to ensure that your baby is exclusively fed your breast milk, even at times when you are unable to breastfeed. This   is especially important if you are going back to work while you are still breastfeeding or when  you are not able to be present during feedings. Your lactation consultant can give you guidelines on how long it is safe to store breast milk.  °A breast pump is a machine that allows you to pump milk from your breast into a sterile bottle. The pumped breast milk can then be stored in a refrigerator or freezer. Some breast pumps are operated by hand, while others use electricity. Ask your lactation consultant which type will work best for you. Breast pumps can be purchased, but some hospitals and breastfeeding support groups lease breast pumps on a monthly basis. A lactation consultant can teach you how to hand express breast milk, if you prefer not to use a pump.  °CARING FOR YOUR BREASTS WHILE YOU BREASTFEED °Nipples can become dry, cracked, and sore while breastfeeding. The following recommendations can help keep your breasts moisturized and healthy: °· Avoid using soap on your nipples.   °· Wear a supportive bra. Although not required, special nursing bras and tank tops are designed to allow access to your breasts for breastfeeding without taking off your entire bra or top. Avoid wearing underwire style bras or extremely tight bras. °· Air dry your nipples for 3 4 minutes after each feeding.   °· Use only cotton bra pads to absorb leaked breast milk. Leaking of breast milk between feedings is normal.   °· Use lanolin on your nipples after breastfeeding. Lanolin helps to maintain your skin's normal moisture barrier. If you use pure lanolin you do not need to wash it off before feeding your baby again. Pure lanolin is not toxic to your baby. You may also hand express a few drops of breast milk and gently massage that milk into your nipples and allow the milk to air dry. °In the first few weeks after giving birth, some women experience extremely full breasts (engorgement). Engorgement can make your breasts feel heavy, warm, and tender to the touch. Engorgement peaks within 3 5 days after you give birth. The  following recommendations can help ease engorgement: °· Completely empty your breasts while breastfeeding or pumping. You may want to start by applying warm, moist heat (in the shower or with warm water-soaked hand towels) just before feeding or pumping. This increases circulation and helps the milk flow. If your baby does not completely empty your breasts while breastfeeding, pump any extra milk after he or she is finished. °· Wear a snug bra (nursing or regular) or tank top for 1 2 days to signal your body to slightly decrease milk production. °· Apply ice packs to your breasts, unless this is too uncomfortable for you. °· Make sure that your baby is latched on and positioned properly while breastfeeding. °If engorgement persists after 48 hours of following these recommendations, contact your health care provider or a lactation consultant. °OVERALL HEALTH CARE RECOMMENDATIONS WHILE BREASTFEEDING °· Eat healthy foods. Alternate between meals and snacks, eating 3 of each per day. Because what you eat affects your breast milk, some of the foods may make your baby more irritable than usual. Avoid eating these foods if you are sure that they are negatively affecting your baby. °· Drink milk, fruit juice, and water to satisfy your thirst (about 10 glasses a day).   °· Rest often, relax, and continue to take your prenatal vitamins to prevent fatigue, stress, and anemia. °· Continue breast self-awareness checks. °· Avoid chewing and smoking tobacco. °· Avoid alcohol and   IF:  You feel like you want to stop breastfeeding or have become frustrated with breastfeeding. You have painful breasts or nipples. Your nipples  are cracked or bleeding. Your breasts are red, tender, or warm. You have a swollen area on either breast. You have a fever or chills. You have nausea or vomiting. You have drainage other than breast milk from your nipples. Your breasts do not become full before feedings by the 5th day after you give birth. You feel sad and depressed. Your baby is too sleepy to eat well. Your baby is having trouble sleeping.  Your baby is wetting less than 3 diapers in a 24-hour period. Your baby has less than 3 stools in a 24-hour period. Your baby's skin or the white part of his or her eyes becomes yellow.  Your baby is not gaining weight by 815 days of age. SEEK IMMEDIATE MEDICAL CARE IF:  Your baby is overly tired (lethargic) and does not want to wake up and feed. Your baby develops an unexplained fever. Document Released: 03/25/2005 Document Revised: 11/25/2012 Document Reviewed: 09/16/2012 [facility] Patient Information 2014 [place], [place].  Iron-Rich Diet An iron-rich diet contains foods that are good sources of iron. Iron is an important mineral that helps your body produce hemoglobin. Hemoglobin is a protein in red blood cells that carries oxygen to the body's tissues. Sometimes, the iron level in your blood can be low. This may be caused by:  A lack of iron in your diet.  Blood loss.  Times of growth, such as during pregnancy or during a child's growth and development. Low levels of iron can cause a decrease in the number of red blood cells. This can result in iron deficiency anemia. Iron deficiency anemia symptoms include:  Tiredness.  Weakness.  Irritability.  Increased chance of infection. Here are some recommendations for daily iron intake:  Males older than 36 years of age need 8 mg of iron per day.  Women ages 1119 to 2550 need 18 mg of iron per day.  Pregnant women need 27 mg of iron per day, and women who are over 36 years of age and breastfeeding need 9 mg of iron per  day.  Women over the age of 36 need 8 mg of iron per day. SOURCES OF IRON There are 2 types of iron that are found in food: heme iron and nonheme iron. Heme iron is absorbed by the body better than nonheme iron. Heme iron is found in meat, poultry, and fish. Nonheme iron is found in grains, beans, and vegetables. Heme Iron Sources Food / Iron (mg)  Chicken liver, 3 oz (85 g)/ 10 mg  Beef liver, 3 oz (85 g)/ 5.5 mg  Oysters, 3 oz (85 g)/ 8 mg  Beef, 3 oz (85 g)/ 2 to 3 mg  Shrimp, 3 oz (85 g)/ 2.8 mg  [place], 3 oz (85 g)/ 2 mg  Chicken, 3 oz (85 g) / 1 mg  Fish (tuna, halibut), 3 oz (85 g)/ 1 mg  Pork, 3 oz (85 g)/ 0.9 mg Nonheme Iron Sources Food / Iron (mg)  Ready-to-eat breakfast cereal, iron-fortified / 3.9 to 7 mg  Tofu,  cup / 3.4 mg  Kidney beans,  cup / 2.6 mg  Baked potato with skin / 2.7 mg  Asparagus,  cup / 2.2 mg  Avocado / 2 mg  Dried peaches,  cup / 1.6 mg  Raisins,  cup / 1.5 mg  Soy milk, 1 cup / 1.5 mg  3 oz (85 g)/ 1 mg °· Pork, 3 oz (85 g)/ 0.9 mg °Nonheme Iron Sources °Food / Iron (mg) °· Ready-to-eat breakfast cereal, iron-fortified / 3.9 to 7 mg °· Tofu, ½ cup / 3.4 mg °· Kidney beans, ½ cup / 2.6 mg °· Baked potato with skin / 2.7 mg °· Asparagus, ½ cup / 2.2 mg °· Avocado / 2 mg °· Dried peaches, ½ cup / 1.6 mg °· Raisins, ½ cup / 1.5 mg °· Soy milk, 1 cup / 1.5 mg °· Whole-wheat bread, 1 slice / 1.2 mg °· Spinach, 1 cup / 0.8 mg °· Broccoli, ½ cup / 0.6 mg °IRON ABSORPTION °Certain foods can decrease the body's absorption of iron. Try to avoid these foods and beverages while eating meals with iron-containing foods: °· Coffee. °· Tea. °· Fiber. °· Soy. °Foods containing vitamin C can help increase the amount of iron your body absorbs from iron sources, especially from nonheme sources. Eat foods with vitamin C along with iron-containing foods to increase your iron absorption. Foods that are high in vitamin C include many fruits and vegetables. Some good sources are: °· Fresh orange juice. °· Oranges. °· Strawberries. °· Mangoes. °· Grapefruit. °· Red bell peppers. °· Green bell peppers. °· Broccoli. °· Potatoes with skin. °· Tomato juice. °Document  Released: 11/06/2004 Document Revised: 06/17/2011 Document Reviewed: 09/13/2010 °[employer]® Patient Information ©2014 [employer]. ° °Contraception Choices °Contraception (birth control) is the use of any methods or devices to prevent pregnancy. Below are some methods to help avoid pregnancy. °HORMONAL METHODS  °· Contraceptive implant This is a thin, plastic tube containing progesterone hormone. It does not contain estrogen hormone. Your health care provider inserts the tube in the inner part of the upper arm. The tube can remain in place for up to 3 years. After 3 years, the implant must be removed. The implant prevents the ovaries from releasing an egg (ovulation), thickens the cervical mucus to prevent sperm from entering the uterus, and thins the lining of the inside of the uterus. °· Progesterone-only injections These injections are given every 3 months by your health care provider to prevent pregnancy. This synthetic progesterone hormone stops the ovaries from releasing eggs. It also thickens cervical mucus and changes the uterine lining. This makes it harder for sperm to survive in the uterus. °· Birth control pills These pills contain estrogen and progesterone hormone. They work by preventing the ovaries from releasing eggs (ovulation). They also cause the cervical mucus to thicken, preventing the sperm from entering the uterus. Birth control pills are prescribed by a health care provider. Birth control pills can also be used to treat heavy periods. °· Minipill This type of birth control pill contains only the progesterone hormone. They are taken every day of each month and must be prescribed by your health care provider. °· Birth control patch The patch contains hormones similar to those in birth control pills. It must be changed once a week and is prescribed by a health care provider. °· Vaginal ring The ring contains hormones similar to those in birth control pills. It is left in the vagina for 3 weeks,  removed for 1 week, and then a new one is put back in place. The patient must be comfortable inserting and removing the ring from the vagina. A health care provider's prescription is necessary. °· Emergency contraception Emergency contraceptives prevent pregnancy after unprotected sexual intercourse. This pill can be taken right after sex or up to 5 days after unprotected sex. It   is most effective the sooner you take the pills after having sexual intercourse. Most emergency contraceptive pills are available without a prescription. Check with your pharmacist. Do not use emergency contraception as your only form of birth control. °BARRIER METHODS  °· Female condom This is a thin sheath (latex or rubber) that is worn over the penis during sexual intercourse. It can be used with spermicide to increase effectiveness. °· Female condom. This is a soft, loose-fitting sheath that is put into the vagina before sexual intercourse. °· Diaphragm This is a soft, latex, dome-shaped barrier that must be fitted by a health care provider. It is inserted into the vagina, along with a spermicidal jelly. It is inserted before intercourse. The diaphragm should be left in the vagina for 6 to 8 hours after intercourse. °· Cervical cap This is a round, soft, latex or plastic cup that fits over the cervix and must be fitted by a health care provider. The cap can be left in place for up to 48 hours after intercourse. °· Sponge This is a soft, circular piece of polyurethane foam. The sponge has spermicide in it. It is inserted into the vagina after wetting it and before sexual intercourse. °· Spermicides These are chemicals that kill or block sperm from entering the cervix and uterus. They come in the form of creams, jellies, suppositories, foam, or tablets. They do not require a prescription. They are inserted into the vagina with an applicator before having sexual intercourse. The process must be repeated every time you have sexual  intercourse. °INTRAUTERINE CONTRACEPTION °· Intrauterine device (IUD) This is a T-shaped device that is put in a woman's uterus during a menstrual period to prevent pregnancy. There are 2 types: °· Copper IUD This type of IUD is wrapped in copper wire and is placed inside the uterus. Copper makes the uterus and fallopian tubes produce a fluid that kills sperm. It can stay in place for 10 years. °· Hormone IUD This type of IUD contains the hormone progestin (synthetic progesterone). The hormone thickens the cervical mucus and prevents sperm from entering the uterus, and it also thins the uterine lining to prevent implantation of a fertilized egg. The hormone can weaken or kill the sperm that get into the uterus. It can stay in place for 3 5 years, depending on which type of IUD is used. °PERMANENT METHODS OF CONTRACEPTION °· Female tubal ligation This is when the woman's fallopian tubes are surgically sealed, tied, or blocked to prevent the egg from traveling to the uterus. °· Hysteroscopic sterilization This involves placing a small coil or insert into each fallopian tube. Your doctor uses a technique called hysteroscopy to do the procedure. The device causes scar tissue to form. This results in permanent blockage of the fallopian tubes, so the sperm cannot fertilize the egg. It takes about 3 months after the procedure for the tubes to become blocked. You must use another form of birth control for these 3 months. °· Female sterilization This is when the female has the tubes that carry sperm tied off (vasectomy). This blocks sperm from entering the vagina during sexual intercourse. After the procedure, the man can still ejaculate fluid (semen). °NATURAL PLANNING METHODS °· Natural family planning This is not having sexual intercourse or using a barrier method (condom, diaphragm, cervical cap) on days the woman could become pregnant. °· Calendar method This is keeping track of the length of each menstrual cycle and  identifying when you are fertile. °· Ovulation method This is   [employer]

## 2013-05-21 ENCOUNTER — Ambulatory Visit (HOSPITAL_COMMUNITY)
Admission: RE | Admit: 2013-05-21 | Discharge: 2013-05-21 | Disposition: A | Discharge status: Home / Self Care | Payer: BC Managed Care – PPO | Source: Ambulatory Visit | Attending: Obstetrics and Gynecology | Admitting: Obstetrics and Gynecology

## 2013-05-21 NOTE — Lactation Note (Signed)
Adult Lactation Consultation Outpatient Visit Note  Patient Name: Shelsey Dyment(mother)            BABY:EMMA PAK Date of Birth: December 05, 1975                                      DOB: [birth year] Gestational Age at Delivery: 8137.2                        BIRTH WEIGHT: 5-3.4 Type of Delivery: NVD                                         DISCHARGE WEIGHT: 4-15(7%)                                                                              WEIGHT TODAY:5-13 Breastfeeding History: Frequency of Breastfeeding: attempts Length of Feeding:  Voids: qs Stools: qs  Supplementing / Method:EBM 60-70 MLS EVERY 2.5-3 HOURS/BABY GETS FORMULA FOR 2 FEEDINGS AT NIGHT Pumping:  Type of Pump:MEDELA PUMP IN STYLE   Frequency:8 TIMES/24 HOURS  Volume:  90 MLS  Comments:    Consultation Evaluation:Mom and 2616 day old baby here for feeding assessment.  Baby had a difficult latch in the hospital and was started with a 20 mm nipple shield.  Baby also was treated for jaundice with double phototherapy.  Mom states she has been very stressed about feedings, pumpings and concerns about the baby in general.  Discussed normal feelings of being a first time mother and difference between normal baby blues and post partum depression.  Answered many questions and reassurance given.  Mom was trying to pump 10 times per day and recently decreased to 8 times per day which has alleviated some stress.  Baby fussy after weight and crying at breast. Attempted soothing techniques but needed to give the baby a few swallows from formula bottle to calm her to latch.  Baby unable to latch without using a 24 mm nipple shield.  Mom was attempting to latch with a 20 mm at home but it was pinching.  Mom expressed breast milk into shield and baby latched easily and nursed actively on both breasts.  Baby transferred 52 mls and came off relaxed and content.  Mom seemed much more relaxed after the successful feeding.  Plan is to try to nurse baby at most  feedings using nipple shield and pumping and bottle feeding when baby wont or she chooses not to put baby to breast.  Breastfeeding support group highly recommended.  Initial Feeding Assessment:15MINUTES ON EACH BREAST Pre-feed [id number]:1478Weight:2636 Post-feed [id number]:3086Weight:2688 Amount Transferred:52 MLS Comments:  Additional Feeding Assessment: Pre-feed Weight: Post-feed Weight: Amount Transferred: Comments:  Additional Feeding Assessment: Pre-feed Weight: Post-feed Weight: Amount Transferred: Comments:  Total Breast milk Transferred this Visit: 52 MLS Total Supplement Given: NONE  Additional Interventions:   Follow-Up  WILL CALL PRN AND ATTEND SUPPORT GROUP      Hansel Feinsteinowell, [place] Ann 05/21/2013, 9:45 AM

## 2013-05-26 ENCOUNTER — Ambulatory Visit (HOSPITAL_COMMUNITY): Payer: BC Managed Care – PPO

## 2013-09-21 ENCOUNTER — Other Ambulatory Visit: Payer: Self-pay | Admitting: Obstetrics and Gynecology

## 2014-02-07 ENCOUNTER — Encounter (HOSPITAL_COMMUNITY): Payer: Self-pay

## 2014-02-14 IMAGING — US US OB MCA DOPPLER
1 series · 16 of 28 positions shown · non-contrast
Comparison: none

[Series 1: us ob mca doppler · 0.29mm/px · 74 acquisitions, 16 frames shown]
[im 1/74]
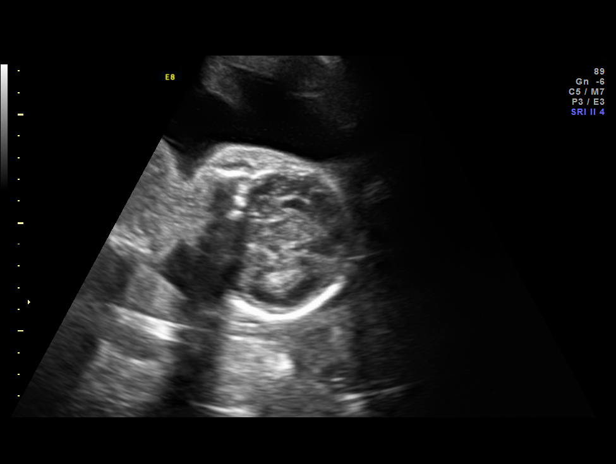
[im 6/74]
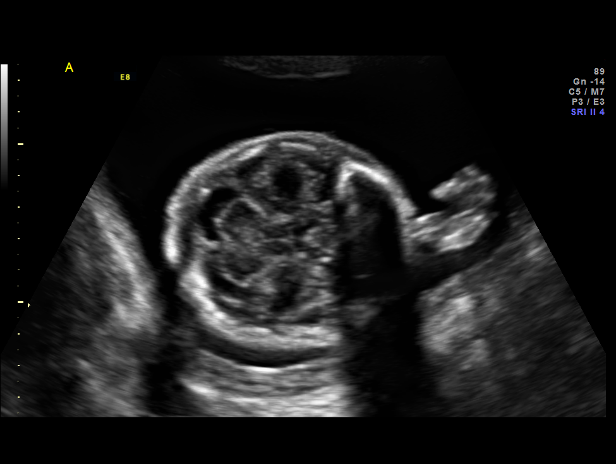
[im 11/74]
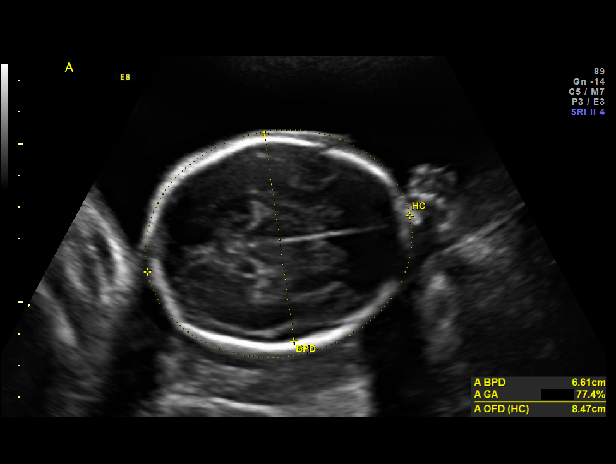
[im 17/74]
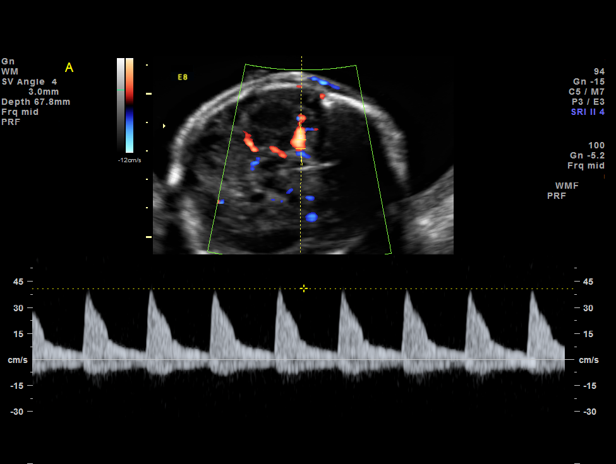
[im 19/74]
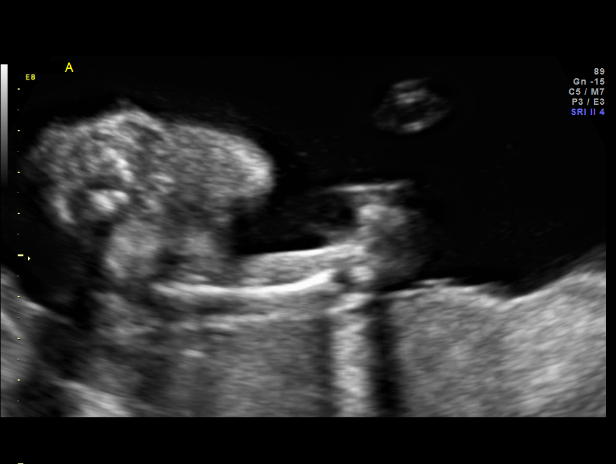
[im 25/74]
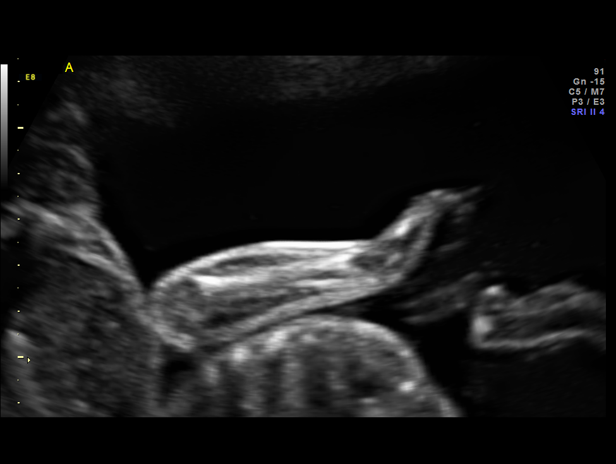
[im 30/74]
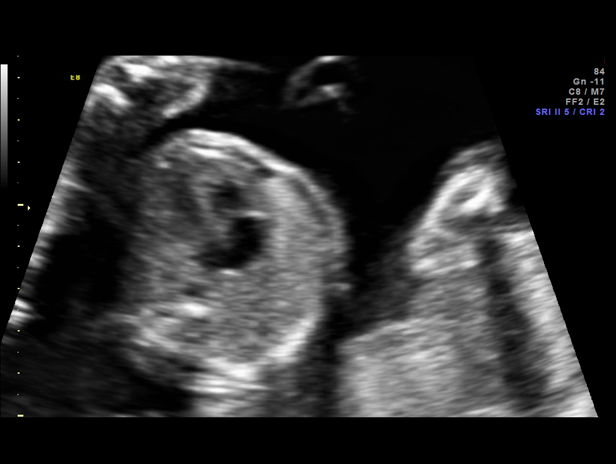
[im 36/74]
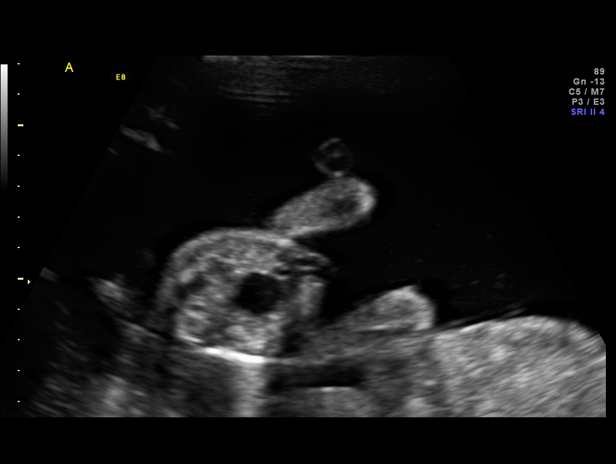
[im 38/74]
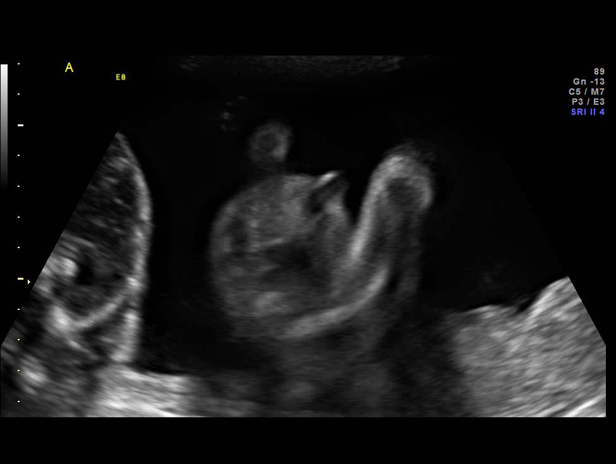
[im 44/74]
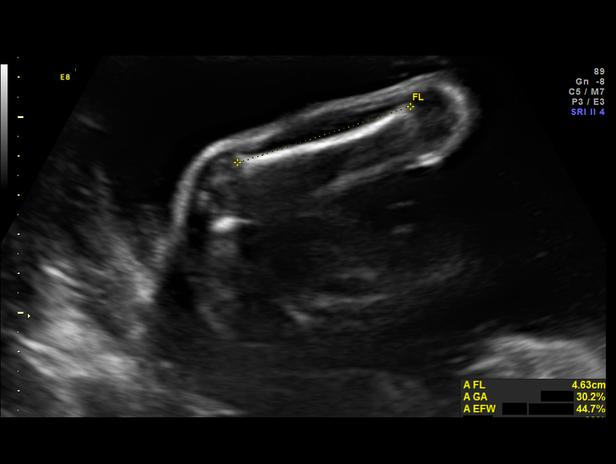
[im 49/74]
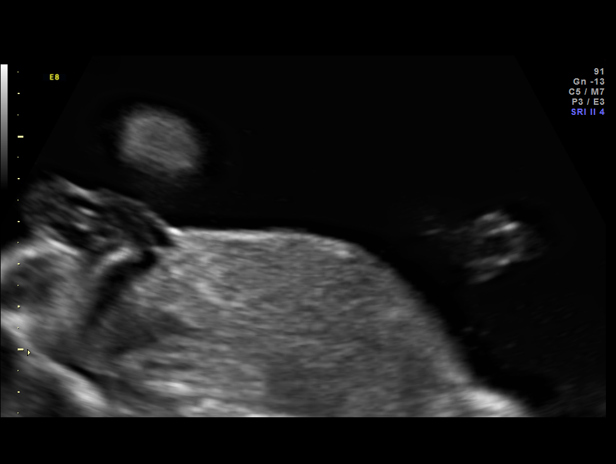
[im 55/74]
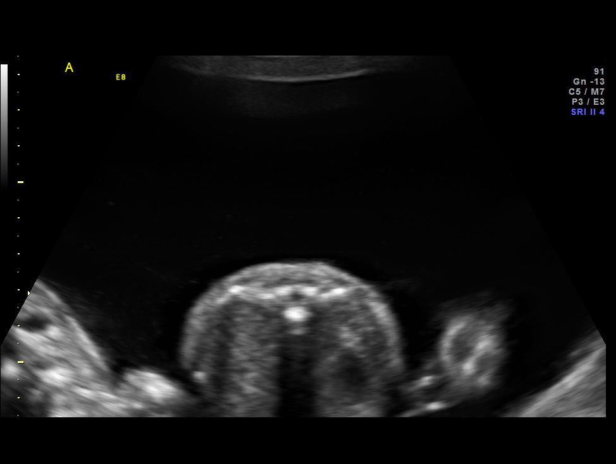
[im 57/74]
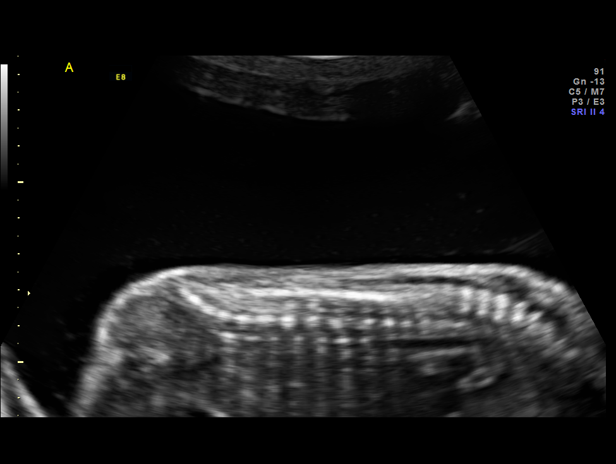
[im 63/74]
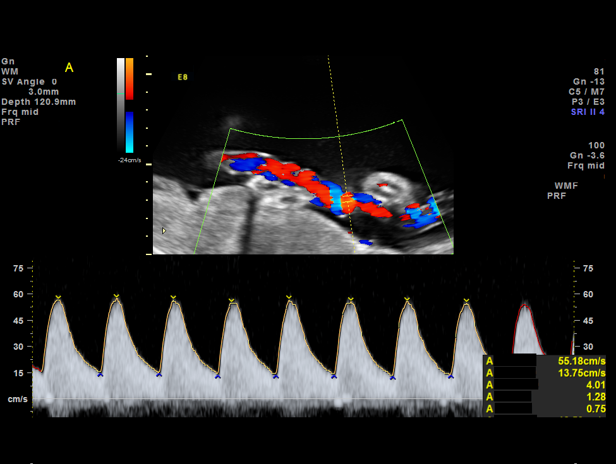
[im 68/74]
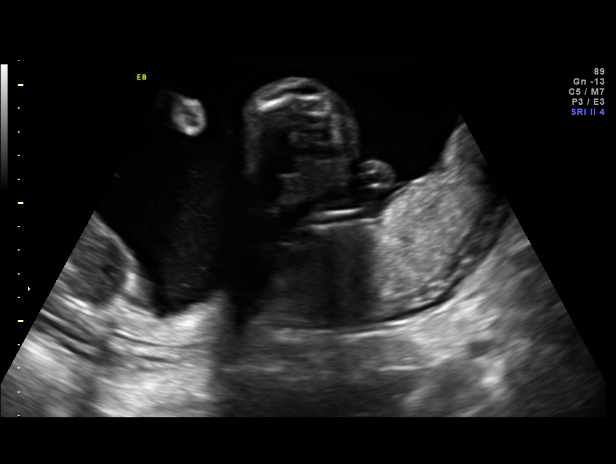
[im 74/74]
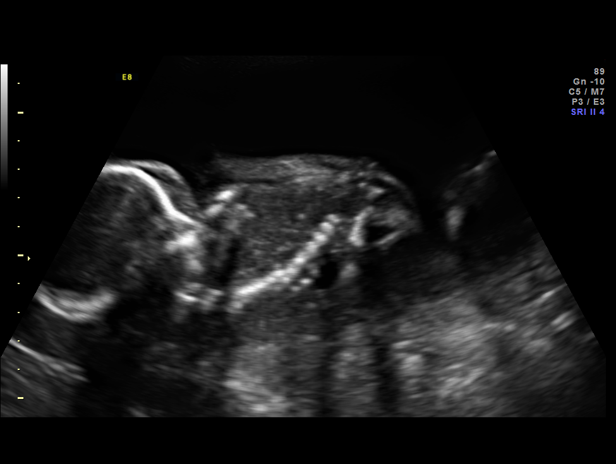

[16 of 28 positions shown; findings below may reference images not displayed]

OBSTETRICS REPORT
                      (Signed Final 02/12/2013 [DATE])

Service(s) Provided

 US OB DETAIL + 14 WK                                  76811.0
 US UA CORD DOPPLER                                    76820.0
 US MCA DOPPLER                                        76821.0
Indications

 Twin gestation, Jose David
 Twin gestation, demise of 1 fetus
 Advanced maternal age (AMA), Primigravida
Fetal Evaluation (Fetus A)

 Num Of Fetuses:    2
 Fetal Heart Rate:  153                          bpm
 Cardiac Activity:  Observed
 Fetal Lie:         Left Fetus
 Presentation:      Cephalic
 Placenta:          Posterior, above cervical
                    os
 P. Cord            Previously Visualized
 Insertion:

 Membrane Desc:     Dividing
                    Membrane seen
                    - Monochorionic

 Amniotic Fluid
 AFI FV:      Polyhydramnios
                                             Larg Pckt:    10.4  cm
Biometry (Fetus A)

 BPD:     66.6  mm     G. Age:  26w 6d                CI:         78.7   70 - 86
 OFD:     84.6  mm                                    FL/HC:      18.8   18.6 -

 HC:     243.7  mm     G. Age:  26w 3d       58  %    HC/AC:      1.22   1.04 -

 AC:     199.7  mm     G. Age:  24w 4d       16  %    FL/BPD:     68.9   71 - 87
 FL:      45.9  mm     G. Age:  25w 2d       26  %    FL/AC:      23.0   20 - 24
 HUM:     41.5  mm     G. Age:  25w 1d       32  %

 Est. FW:     773  gm    1 lb 11 oz      44  %     FW Discordancy      0 \ 0 %
Gestational Age (Fetus A)
 U/S Today:     25w 6d                                        EDD:   05/22/13
 Best:          25w 4d     Det. By:  Early Ultrasound         EDD:   05/24/13
Anatomy (Fetus A)

 Cranium:          Appears normal         Aortic Arch:      Not well visualized
 Fetal Cavum:      Appears normal         Ductal Arch:      Not well visualized
 Ventricles:       Appears normal         Diaphragm:        Appears normal
 Choroid Plexus:   Appears normal         Stomach:          Appears normal, left
                                                            sided
 Cerebellum:       Appears normal         Abdomen:          Appears normal
 Posterior Fossa:  Appears normal         Abdominal Wall:   Appears nml (cord
                                                            insert, abd wall)
 Nuchal Fold:      Not applicable (>20    Cord Vessels:     Appears normal (3
                   wks GA)                                  vessel cord)
 Face:             Appears normal         Kidneys:          Appear normal
                   (orbits and profile)
 Lips:             Appears normal         Bladder:          Appears normal
 Palate:           Not well visualized    Spine:            Appears normal
 Heart:            Appears normal         Lower             Appears normal
                   (4CH, axis, and        Extremities:
                   situs)
 RVOT:             Not well visualized    Upper             Appears normal
                                          Extremities:
 LVOT:             Appears normal

 Other:  Fetus appears to be a female. Heels and 5th digit appear normal.
Doppler - Fetal Vessels (Fetus A)

 Umbilical Artery
 S/D:   4.35           89  %tile       RI:
 PI:    1.35                           PSV:       55.18   cm/s

 Middle Cerebral Artery
                                       PSV:       41.02   cm/s      1.25  MoM

Fetal Evaluation (Fetus B)

 Num Of Fetuses:    2
 Cardiac Activity:  Observed
Gestational Age (Fetus B)

 Best:          25w 4d     Det. By:  Early Ultrasound         EDD:   05/24/13
Cervix Uterus Adnexa

 Cervical Length:    4        cm

 Cervix:       Normal appearance by transabdominal scan. Appears
               closed, without funnelling.
Impression

 [place] gestation at 25+4 weeks

 Twin A:
 -attempted comprehensive fetal survey without apparent
 structural defects
 -polyhydramnios
 -normally filled stomach and bladder
 -UA Doppler S/D is between mean and +1SD for gestational
 age (normal)
 -MCA Doppler PSV is 1.25 MoM
 -no hydrops

 Twin B:
 -near anhydramnios, "stuck" twin
 -absent bladder
 -absent stomach
 -demise

 TTTS Stage V

 Today's findings and implications were immediately
 discussed with the patient by way of consultation.
Recommendations

 I placed the patient on CEFM and contacted Dr. Nya
 (patient's OB provider).  I also contacted Dr. Madori
 Chuen Chuen of [HOS[facility]] [facility]] along with her nurse
 [place] to learn she is not a candidate for invasive procedures
 to treat TTTS.

 Hence, recommendations include the following:
 -initiate course of antenatal steroids;
 -CEFM with delivery for evidence of fetal distress
 -twice weekly ultrasounds ([REDACTED]/[REDACTED]) for Doppler
 and hydrops.
 -patient was sent to antepartum for direct admission with my
 recommendations communicated to Dr. Nya and the
 patient.

 questions or concerns.
